# Patient Record
Sex: Male | Born: 1970 | Race: White | Hispanic: No | Marital: Married | Smoking: Never smoker
Health system: Southern US, Community
[De-identification: ages and names within clinical notes are randomized; demographics above are authoritative.]

## PROBLEM LIST (undated history)

## (undated) DIAGNOSIS — K219 Gastro-esophageal reflux disease without esophagitis: Secondary | ICD-10-CM

## (undated) DIAGNOSIS — I10 Essential (primary) hypertension: Secondary | ICD-10-CM

## (undated) DIAGNOSIS — F419 Anxiety disorder, unspecified: Secondary | ICD-10-CM

## (undated) DIAGNOSIS — T7840XA Allergy, unspecified, initial encounter: Secondary | ICD-10-CM

## (undated) HISTORY — DX: Anxiety disorder, unspecified: F41.9

## (undated) HISTORY — DX: Gastro-esophageal reflux disease without esophagitis: K21.9

## (undated) HISTORY — DX: Essential (primary) hypertension: I10

## (undated) HISTORY — DX: Allergy, unspecified, initial encounter: T78.40XA

---

## 2014-08-08 ENCOUNTER — Ambulatory Visit (INDEPENDENT_AMBULATORY_CARE_PROVIDER_SITE_OTHER): Payer: Self-pay | Admitting: Emergency Medicine

## 2014-08-08 VITALS — BP 146/100 | HR 66 | Temp 97.5°F | Resp 18 | Ht 71.0 in | Wt 221.0 lb

## 2014-08-08 DIAGNOSIS — Z021 Encounter for pre-employment examination: Secondary | ICD-10-CM

## 2014-08-08 NOTE — Progress Notes (Signed)
[facility] [address], [place] [address] [phone]  Date:  08/08/2014   Name:  Mark Gamble   DOB:  03-12-1971   MRN:  [id number]  PCP:  No PCP Per Patient    Chief Complaint: Employment Physical   History of Present Illness:  Mark Hawthorneaul Stanislawski is a 43 y.o. very pleasant male patient who presents with the following:  DOT   There are no active problems to display for this patient.   No past medical history on file.  No past surgical history on file.  History  Substance Use Topics  . Smoking status: Not on file  . Smokeless tobacco: Not on file  . Alcohol Use: Not on file    No family history on file.  Not on File  Medication list has been reviewed and updated.  No current outpatient prescriptions on file prior to visit.   No current facility-administered medications on file prior to visit.    Review of Systems:  As per HPI, otherwise negative.    Physical Examination: Filed Vitals:   08/08/14 1338  BP: 146/100  Pulse: 66  Temp: 97.5 F (36.4 C)  Resp: 18   Filed Vitals:   08/08/14 1338  Height: 5\' 11"  (1.803 m)  Weight: 221 lb (100.245 kg)   Body mass index is 30.84 kg/(m^2). Ideal Body Weight: Weight in (lb) to have BMI = 25: 178.9  GEN: WDWN, NAD, Non-toxic, A & O x 3 HEENT: Atraumatic, Normocephalic. Neck supple. No masses, No LAD. Ears and Nose: No external deformity. CV: RRR, No M/G/R. No JVD. No thrill. No extra heart sounds. PULM: CTA B, no wheezes, crackles, rhonchi. No retractions. No resp. distress. No accessory muscle use. ABD: S, NT, ND, +BS. No rebound. No HSM. EXTR: No c/c/e NEURO Normal gait.  PSYCH: Normally interactive. Conversant. Not depressed or anxious appearing.  Calm demeanor.    Assessment and Plan: DOT  Signed,  Phillips OdorJeffery Anderson, MD

## 2015-12-12 ENCOUNTER — Ambulatory Visit (HOSPITAL_COMMUNITY)
Admission: EM | Admit: 2015-12-12 | Discharge: 2015-12-12 | Disposition: A | Discharge status: Home / Self Care | Payer: 59 | Attending: Emergency Medicine | Admitting: Emergency Medicine

## 2015-12-12 ENCOUNTER — Encounter (HOSPITAL_COMMUNITY): Payer: Self-pay | Admitting: Emergency Medicine

## 2015-12-12 DIAGNOSIS — J302 Other seasonal allergic rhinitis: Secondary | ICD-10-CM | POA: Diagnosis not present

## 2015-12-12 NOTE — Discharge Instructions (Signed)
Allergic Rhinitis Allergic rhinitis is when the mucous membranes in the nose respond to allergens. Allergens are particles in the air that cause your body to have an allergic reaction. This causes you to release allergic antibodies. Through a chain of events, these eventually cause you to release histamine into the blood stream. Although meant to protect the body, it is this release of histamine that causes your discomfort, such as frequent sneezing, congestion, and an itchy, runny nose.  CAUSES Seasonal allergic rhinitis (hay fever) is caused by pollen allergens that may come from grasses, trees, and weeds. Year-round allergic rhinitis (perennial allergic rhinitis) is caused by allergens such as house dust mites, pet dander, and mold spores. SYMPTOMS  Nasal stuffiness (congestion).  Itchy, runny nose with sneezing and tearing of the eyes. DIAGNOSIS Your health care provider can help you determine the allergen or allergens that trigger your symptoms. If you and your health care provider are unable to determine the allergen, skin or blood testing may be used. Your health care provider will diagnose your condition after taking your health history and performing a physical exam. Your health care provider may assess you for other related conditions, such as asthma, pink eye, or an ear infection. TREATMENT Allergic rhinitis does not have a cure, but it can be controlled by:  Medicines that block allergy symptoms. These may include allergy shots, nasal sprays, and oral antihistamines.  Avoiding the allergen. Hay fever may often be treated with antihistamines in pill or nasal spray forms. Antihistamines block the effects of histamine. There are over-the-counter medicines that may help with nasal congestion and swelling around the eyes. Check with your health care provider before taking or giving this medicine. If avoiding the allergen or the medicine prescribed do not work, there are many new medicines  your health care provider can prescribe. Stronger medicine may be used if initial measures are ineffective. Desensitizing injections can be used if medicine and avoidance does not work. Desensitization is when a patient is given ongoing shots until the body becomes less sensitive to the allergen. Make sure you follow up with your health care provider if problems continue. HOME CARE INSTRUCTIONS It is not possible to completely avoid allergens, but you can reduce your symptoms by taking steps to limit your exposure to them. It helps to know exactly what you are allergic to so that you can avoid your specific triggers. SEEK MEDICAL CARE IF:  You have a fever.  You develop a cough that does not stop easily (persistent).  You have shortness of breath.  You start wheezing.  Symptoms interfere with normal daily activities.   This information is not intended to replace advice given to you by your health care provider. Make sure you discuss any questions you have with your health care provider.   Document Released: 05/18/2001 Document Revised: 09/13/2014 Document Reviewed: 04/30/2013 Elsevier Interactive Patient Education 2016 [employer] Inc.  

## 2015-12-12 NOTE — ED Provider Notes (Signed)
CSN: [id number]     Arrival date & time 12/12/15  1600 History   First MD Initiated Contact with Patient 12/12/15 1714     Chief Complaint  Patient presents with  . 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 Needs note to return to work. No other symptoms. States flonase helped symptoms a great dea.  History reviewed. No pertinent past medical history. History reviewed. No pertinent past surgical history. No family history on file. Social History  Substance Use Topics  . Smoking status: Never Smoker   . Smokeless tobacco: None  . Alcohol Use: No    Review of Systems Allergies, no sob Allergies  Review of patient's allergies indicates no known allergies.  Home Medications   Prior to Admission medications   Medication Sig Start Date End Date Taking? Authorizing Provider  fluticasone (FLONASE) 50 MCG/ACT nasal spray Place into both nostrils daily.   Yes Historical Provider, MD   Meds Ordered and Administered this Visit  Medications - No data to display  BP 154/99 mmHg  Pulse 68  Temp(Src) 98.1 F (36.7 C) (Oral)  SpO2 97% No data found.   Physical Exam NURSES NOTES AND VITAL SIGNS REVIEWED. CONSTITUTIONAL: Well developed, well nourished, no acute distress HEENT: normocephalic, atraumatic EYES: Conjunctiva normal NECK:normal ROM, supple, no adenopathy PULMONARY:No respiratory distress, normal effort, LUNGS CTA b/l MUSCULOSKELETAL: Normal ROM of all extremities,  SKIN: warm and dry without rash PSYCHIATRIC: Mood and affect, behavior are normal  ED Course  Procedures (including critical care time)  Labs Review Labs Reviewed - No data to display  Imaging Review No results found.   Visual Acuity Review  Right Eye Distance:   Left Eye Distance:   Bilateral Distance:    Right Eye Near:   Left Eye Near:    Bilateral Near:          MDM  No diagnosis found.  Patient is reassured that there are no issues that require transfer to higher level of care at this time or additional tests. Patient is advised to continue home symptomatic treatment. Patient is advised that if there are new or worsening symptoms to attend the emergency department, contact primary care provider, or return to UC. Instructions of care provided discharged home in stable condition.    THIS NOTE WAS GENERATED USING A VOICE RECOGNITION SOFTWARE PROGRAM. ALL REASONABLE EFFORTS  WERE MADE TO PROOFREAD THIS DOCUMENT FOR ACCURACY.  I have verbally reviewed the discharge instructions with the patient. A printed AVS was given to the patient.  All questions were answered prior to discharge.      Tharon AquasFrank C Harshika Mago, PA 12/12/15 1737

## 2015-12-12 NOTE — ED Notes (Signed)
Here for itchy throat, dry cough and HA onset x1 week... Taking flonase w/releif.  A&O x4... No acute distress.

## 2019-10-12 ENCOUNTER — Other Ambulatory Visit: Payer: Self-pay | Admitting: Gastroenterology

## 2019-10-12 DIAGNOSIS — R1011 Right upper quadrant pain: Secondary | ICD-10-CM

## 2019-10-15 ENCOUNTER — Ambulatory Visit
Admission: RE | Admit: 2019-10-15 | Discharge: 2019-10-15 | Disposition: A | Discharge status: Home / Self Care | Payer: 59 | Source: Ambulatory Visit | Attending: Gastroenterology | Admitting: Gastroenterology

## 2019-10-15 DIAGNOSIS — R1011 Right upper quadrant pain: Secondary | ICD-10-CM

## 2021-04-26 IMAGING — US US ABDOMEN LIMITED
1 series · 14 of 25 positions shown · non-contrast
Comparison: None.

CLINICAL DATA: Right upper quadrant abdominal pain for 4 years.

EXAM:
ULTRASOUND ABDOMEN LIMITED RIGHT UPPER QUADRANT

[Series 1: us abdomen limited · 0.23mm/px · 14 of 45 slices shown]
[im 1/45]
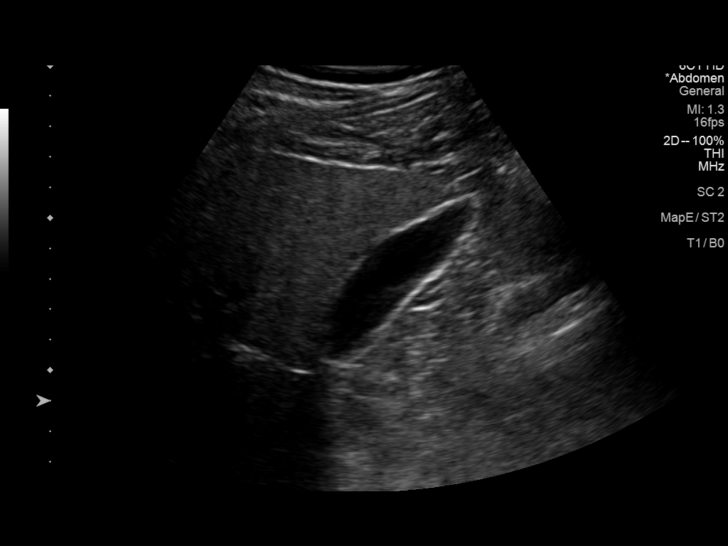
[im 4/45]
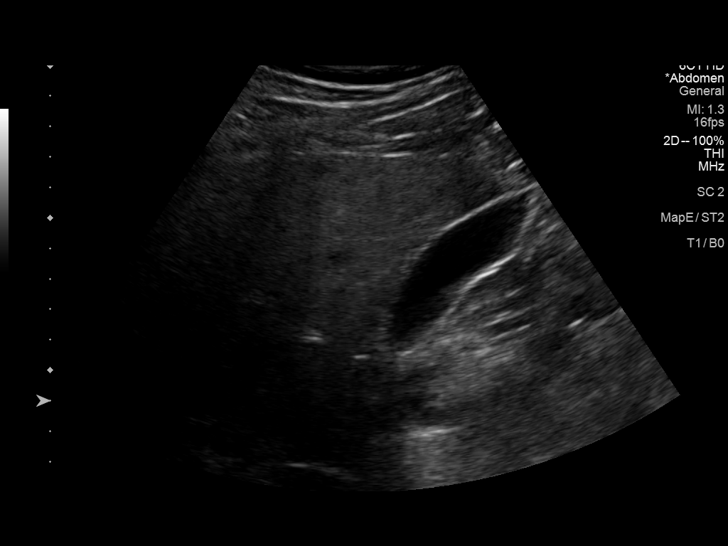
[im 8/45]
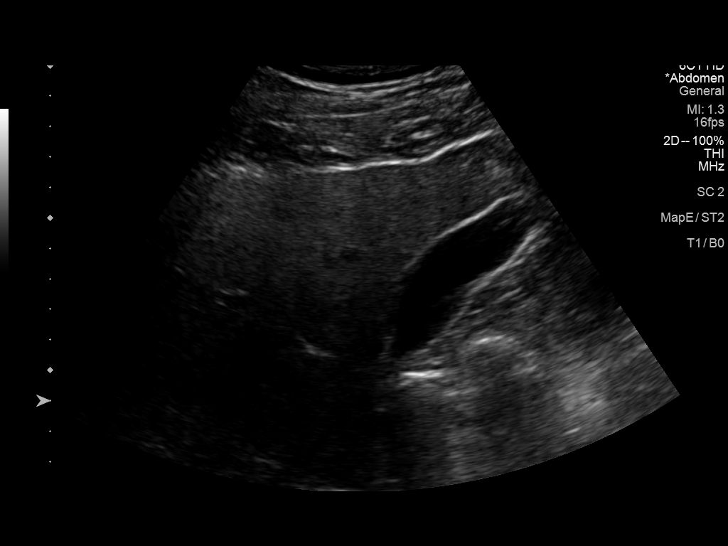
[im 12/45]
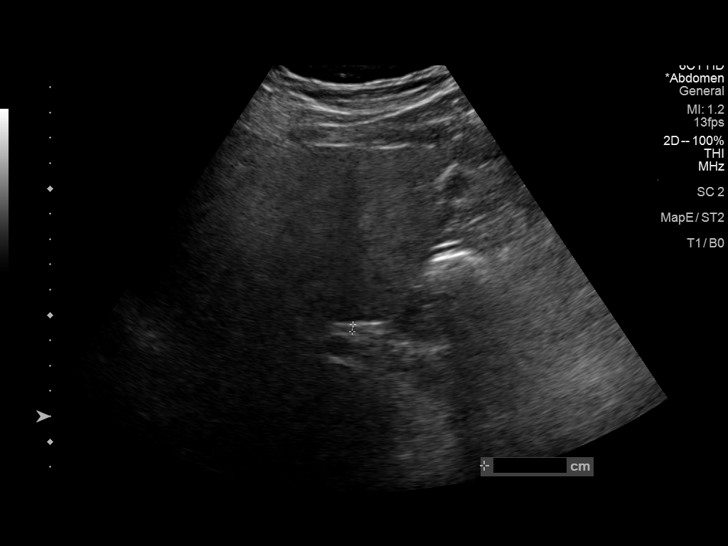
[im 15/45]
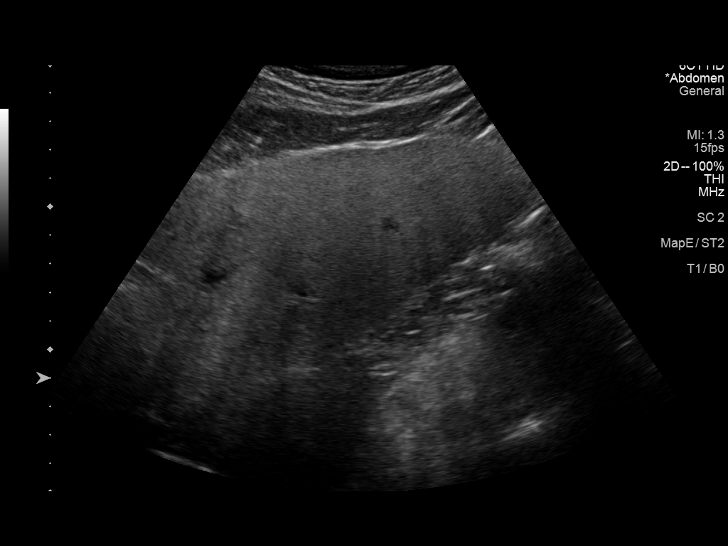
[im 17/45]
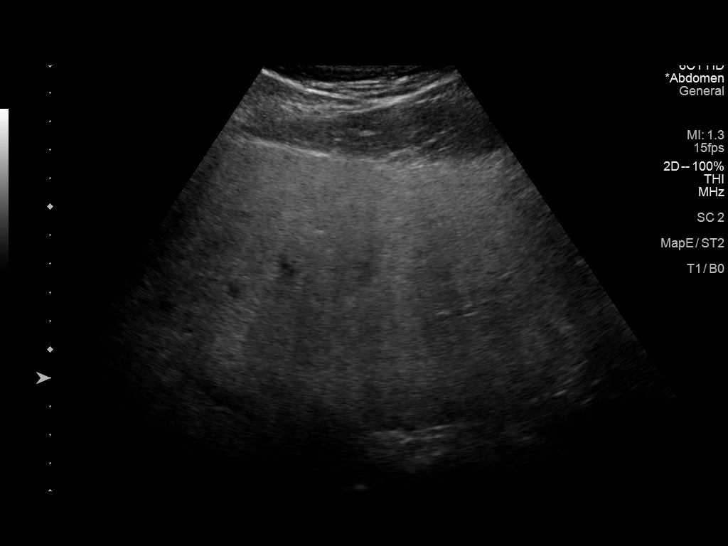
[im 21/45]
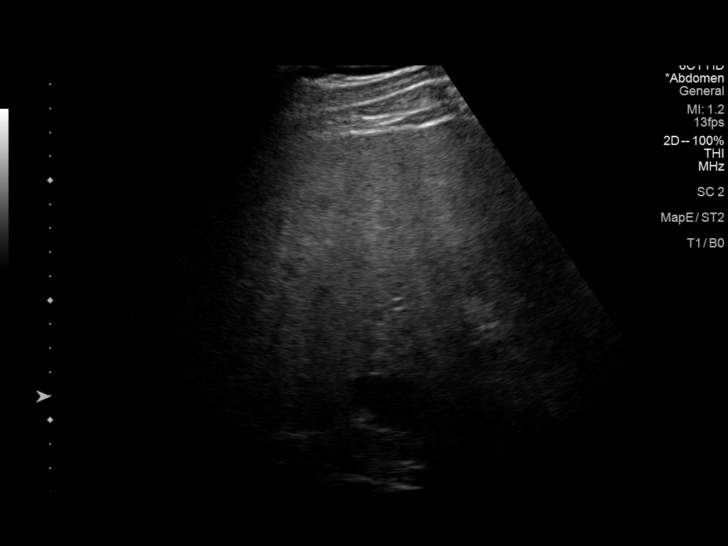
[im 24/45]
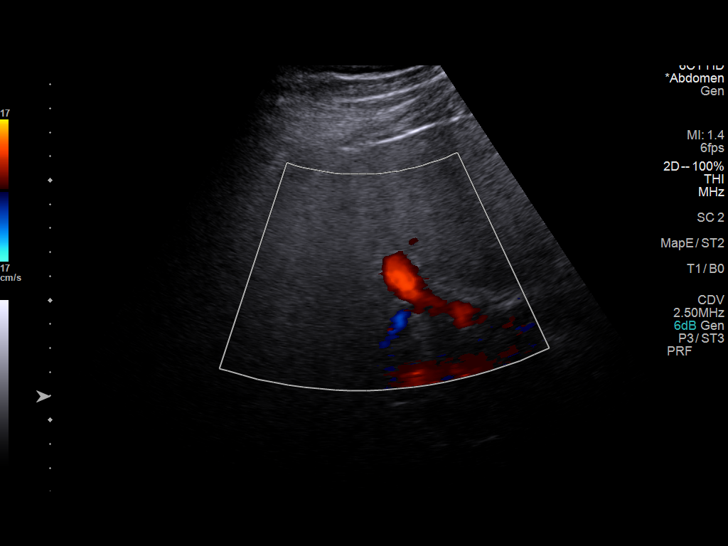
[im 28/45]
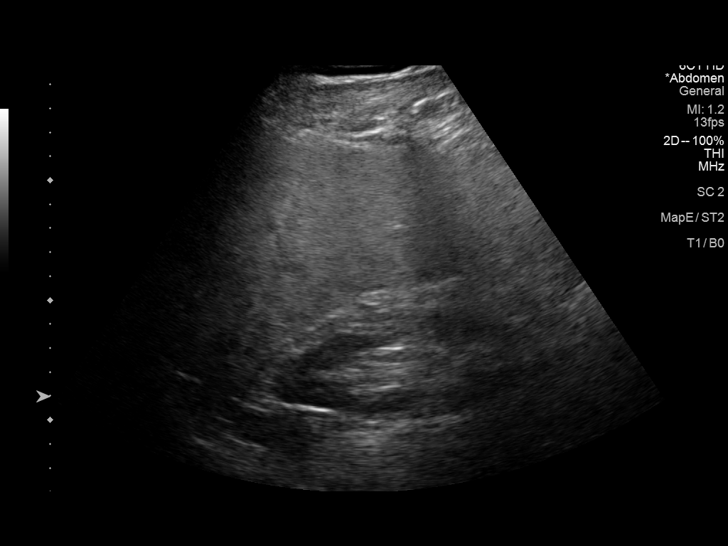
[im 30/45]
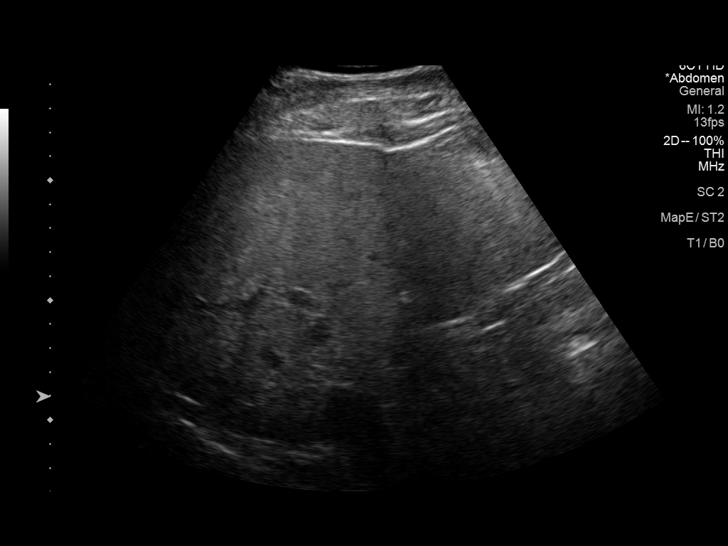
[im 34/45]
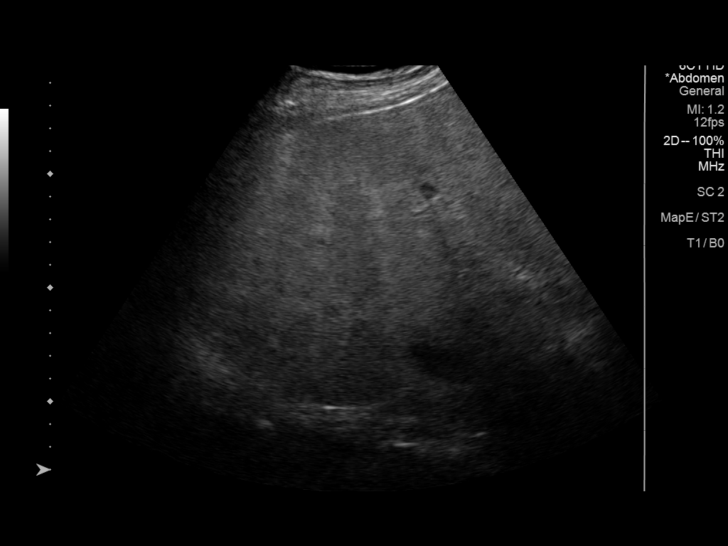
[im 37/45]
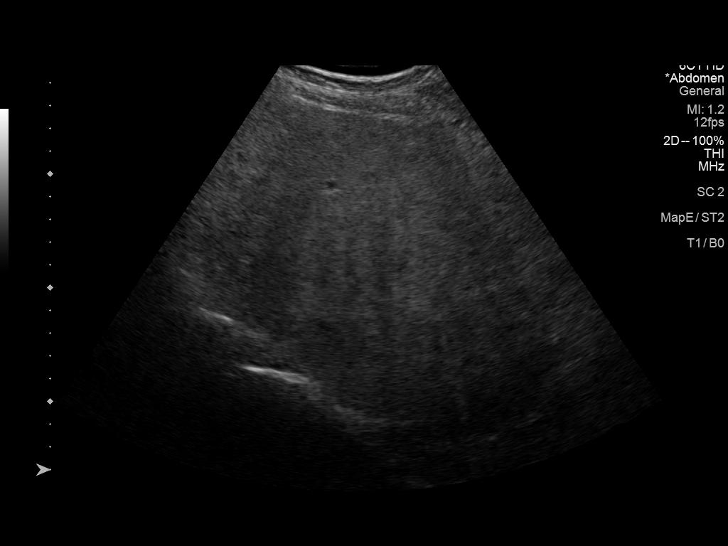
[im 41/45]
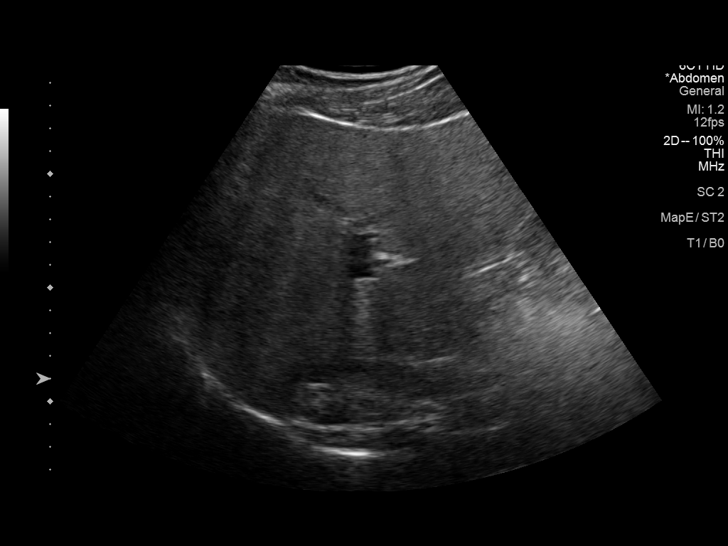
[im 45/45]
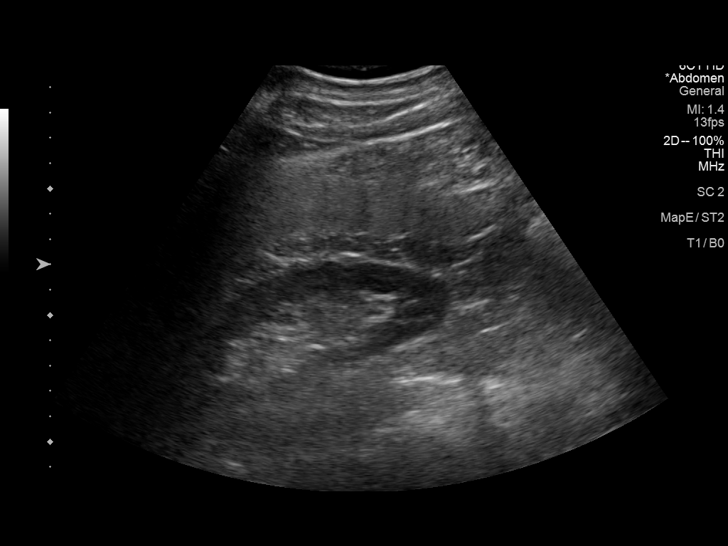

[14 of 25 positions shown; findings below may reference images not displayed]

FINDINGS: Gallbladder:

No gallstones or wall thickening visualized. No sonographic Murphy
sign noted by sonographer.

Common bile duct:

Diameter: 3 mm

Liver:

Coarse hepatic echotexture with possible mild contour irregularity.
No focal lesions are identified. Portal vein is patent on color
Doppler imaging with normal direction of blood flow towards the
liver.

Other: No ascites.
IMPRESSION: 1. No acute right upper quadrant findings. The gallbladder appears
normal.
2. Coarse appendix echotexture with possible mild contour
irregularity, suggesting possible cirrhosis. No focal lesions
identified.

## 2021-05-29 ENCOUNTER — Encounter: Payer: BLUE CROSS/BLUE SHIELD | Admitting: Family Medicine

## 2021-06-03 ENCOUNTER — Encounter: Payer: BLUE CROSS/BLUE SHIELD | Admitting: Family Medicine

## 2021-06-08 NOTE — Progress Notes (Signed)
HPI: Mark Gamble is a 50 y.o. male, who is here today to establish care.  Former PCP: Dr Mark Gamble. Last preventive routine visit: 08/2015.  Chronic medical problems: Allergies, GERD, and elevated BP readings.  Concerns today: Depressed mood and worsening anxiety.  Since COVID 5 pandemia he is having intermittent episodes of panic attacks, depressed mood,insomnia,nausea,shaking, and diarreha.  He is gaining wt. Trying to eat healthier. No motivation. 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 Denies hx o depression or anxiety but reports being institutionalized at age 69 after his father found out that he was smoking marijuana.  At that time he was given haloperidol + other medications. Denies history of bipolar disorder.  Father committed suicide after being diagnosed with lung cancer.  He is drinking alcohol to help him sleep. He has hx of elevated transaminases. 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  According to patient, he was seen by GI for nausea and diarrhea, Dx;ed with IBS. He underwent EGD about 2 months ago, he has not had a colonoscopy done.  Depression screen PHQ 2/9 06/09/2021  Decreased Interest 3  Down, Depressed, Hopeless 2  PHQ - 2 Score 5  Altered sleeping 3  Tired, decreased energy 1  Change in appetite 2  Feeling bad or failure about yourself  3  Trouble concentrating 1  Moving slowly or fidgety/restless 2  Suicidal thoughts 1  PHQ-9 Score 18  Difficult doing work/chores Very difficult   GAD 7 : Generalized Anxiety Score 06/09/2021  Nervous, Anxious, on Edge 3  Control/stop worrying 3  Worry too much - different things 3  Trouble relaxing 3  Restless 3  Easily annoyed or irritable 2  Afraid - awful might happen 2  Total GAD 7 Score 19  Anxiety Difficulty Very difficult      Elevated BP today. No known hx  of HTN.  In 211/015 BP was elevated at 153/101. He has BP readings: 150s/95, 138/98, 127/94, 234/119 (he was under a lot of stress).  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 HR high 90's-112/min.  Negative for severe/frequent headache, visual changes, chest pain, dyspnea, palpitation, focal weakness, or edema.   Ref Range & Units 6 yr ago  Glucose 65 - 99 mg/dL 92   BUN 6 - 24 mg/dL 18   Creatinine 0.76 - 1.27 mg/dL 1.16   eGFR If NonAfrican American >59 mL/min/1.73 76   eGFR If African American >59 mL/min/1.73 88   BUN/Creatinine Ratio 9 - 20 16   Sodium 134 - 144 mmol/L 142   Potassium 3.5 - 5.2 mmol/L 5.1   Chloride 97 - 108 mmol/L 99   CO2 18 - 29 mmol/L 21   CALCIUM 8.7 - 10.2 mg/dL 10.3 High    Total Protein 6.0 - 8.5 g/dL 7.9   Albumin, Serum 3.5 - 5.5 g/dL 5.0   Globulin, Total 1.5 - 4.5 g/dL 2.9   Albumin/Globulin Ratio 1.1 - 2.5 1.7   Total Bilirubin 0.0 - 1.2 mg/dL 0.4   Alkaline Phosphatase 39 - 117 IU/L 54   AST 0 - 40 IU/L 43 High    ALT (SGPT) 0 - 44 IU/L 47 High     Review of Systems  Constitutional:  Positive for fatigue. Negative for activity change, appetite change and fever.  HENT:  Negative for nosebleeds and sore throat.  Respiratory:  Negative for cough and wheezing.   Gastrointestinal:  Negative for abdominal pain and vomiting.  Genitourinary:  Negative for decreased urine volume, dysuria and hematuria.  Neurological:  Positive for tremors. Negative for syncope and weakness.  Psychiatric/Behavioral:  Positive for sleep disturbance. Negative for confusion and hallucinations. The patient is nervous/anxious.   Rest see pertinent positives and negatives per HPI.  No current outpatient medications on file prior to visit.   No current facility-administered medications on file prior to visit.   Past Medical History:  Diagnosis Date   Anxiety    GERD (gastroesophageal reflux disease)    No Known Allergies  Family History  Problem Relation Age of  Onset   Depression Father    Anxiety disorder Father    Cancer Father        lung cancer   Alcohol abuse Father    Social History   Socioeconomic History   Marital status: Married    Spouse name: Not on file   Number of children: Not on file   Years of education: Not on file   Highest education level: Not on file  Occupational History   Not on file  Tobacco Use   Smoking status: Never   Smokeless tobacco: Never  Substance and Sexual Activity   Alcohol use: No   Drug use: Not Currently    Comment: Marijuana at age 36.   Sexual activity: Yes  Other Topics Concern   Not on file  Social History Narrative   Not on file   Social Determinants of Health   Financial Resource Strain: Not on file  Food Insecurity: Not on file  Transportation Needs: Not on file  Physical Activity: Not on file  Stress: Not on file  Social Connections: Not on file   Vitals:   06/09/21 1530  BP: (!) 140/100  Pulse: (!) 102  Resp: 16  SpO2: 98%   Body mass index is 37.41 kg/m.  Physical Exam Vitals and nursing note reviewed.  Constitutional:      General: He is not in acute distress.    Appearance: He is well-developed.  HENT:     Head: Normocephalic and atraumatic.     Mouth/Throat:     Mouth: Mucous membranes are moist.     Pharynx: Oropharynx is clear.  Eyes:     Conjunctiva/sclera: Conjunctivae normal.  Cardiovascular:     Rate and Rhythm: Regular rhythm. Tachycardia present.     Pulses:          Dorsalis pedis pulses are 2+ on the right side and 2+ on the left side.     Heart sounds: No murmur heard.    Comments: Trace pitting LE edema, bilateral. Pulmonary:     Effort: Pulmonary effort is normal. No respiratory distress.     Breath sounds: Normal breath sounds.  Abdominal:     Palpations: Abdomen is soft. There is no hepatomegaly or mass.     Tenderness: There is no abdominal tenderness.  Lymphadenopathy:     Cervical: No cervical adenopathy.  Skin:    General: Skin  is warm.     Findings: No erythema or rash.  Neurological:     Mental Status: He is alert and oriented to person, place, and time.     Cranial Nerves: No cranial nerve deficit.     Motor: Tremor (Mild, hands.) present.     Gait: Gait normal.  Psychiatric:        Mood and Affect: Mood is  anxious.     Comments: Well groomed, good eye contact.   ASSESSMENT AND PLAN:  Mark Gamble was seen today for establish care.  Diagnoses and all orders for this visit: Orders Placed This Encounter  Procedures   Basic metabolic panel   Hepatic function panel   TSH   CBC   VITAMIN D 25 Hydroxy (Vit-D Deficiency, Fractures)   Lab Results  Component Value Date   TSH 0.95 06/09/2021   Lab Results  Component Value Date   ALT 131 (H) 06/09/2021   AST 136 (H) 06/09/2021   ALKPHOS 53 06/09/2021   BILITOT 0.5 06/09/2021   Lab Results  Component Value Date   WBC 10.2 06/09/2021   HGB 14.4 06/09/2021   HCT 42.4 06/09/2021   MCV 93.2 06/09/2021   PLT 183.0 06/09/2021   Lab Results  Component Value Date   CREATININE 1.10 06/09/2021   BUN 15 06/09/2021   NA 138 06/09/2021   K 4.3 06/09/2021   CL 103 06/09/2021   CO2 25 06/09/2021   Elevated transaminase level Hx of HCV infection, treated in 2016. We discussed adverse effects of high alcohol intake. Further recommendation will be given according to LFTs.  Hypercalcemia Mild. We discussed possible etiologies.  Elevated blood pressure reading Recheck: 180/110. He is reporting that home BP readings are slowly getting better, so for now we will hold on pharmacologic treatment. We discussed possible complications of elevated BP. If BP continues elevated, we can consider beta-blocker,which may also help with anxiety and mild tachycardia. Low salt/DASH diet recommended.  GAD (generalized anxiety disorder) CBT may help. Hydroxyzine 25 mg 3 times daily as needed for acute anxiety, we discussed some side effects.  -     escitalopram (LEXAPRO)  10 MG tablet; Take 1 tablet (10 mg total) by mouth daily. -     hydrOXYzine (ATARAX/VISTARIL) 25 MG tablet; Take 1 tablet (25 mg total) by mouth 3 (three) times daily as needed for anxiety.  Depression, major, single episode, severe ([place]) Discussed pharmacologic treatment options, he agrees with trying Lexapro. We discussed some side effects. CBT may help. Instructed about warning signs. F/U in 5 weeks, before if needed.  -     escitalopram (LEXAPRO) 10 MG tablet; Take 1 tablet (10 mg total) by mouth daily.  I spent a total of 68 minutes in both face to face and non face to face activities for this visit on the date of this encounter. During this time history was obtained and documented, examination was performed, prior labs reviewed, and assessment/plan discussed.  Return in about 5 weeks (around 07/14/2021).  Jairen Goldfarb G. [place], MD  [facility]. [place] office.

## 2021-06-09 ENCOUNTER — Encounter: Payer: Self-pay | Admitting: Family Medicine

## 2021-06-09 ENCOUNTER — Ambulatory Visit (INDEPENDENT_AMBULATORY_CARE_PROVIDER_SITE_OTHER): Payer: BC Managed Care – PPO | Admitting: Family Medicine

## 2021-06-09 ENCOUNTER — Other Ambulatory Visit: Payer: Self-pay

## 2021-06-09 VITALS — BP 140/100 | HR 102 | Resp 16 | Ht 71.0 in | Wt 268.2 lb

## 2021-06-09 DIAGNOSIS — F322 Major depressive disorder, single episode, severe without psychotic features: Secondary | ICD-10-CM

## 2021-06-09 DIAGNOSIS — R7401 Elevation of levels of liver transaminase levels: Secondary | ICD-10-CM | POA: Diagnosis not present

## 2021-06-09 DIAGNOSIS — F411 Generalized anxiety disorder: Secondary | ICD-10-CM | POA: Diagnosis not present

## 2021-06-09 DIAGNOSIS — I160 Hypertensive urgency: Secondary | ICD-10-CM | POA: Insufficient documentation

## 2021-06-09 DIAGNOSIS — R030 Elevated blood-pressure reading, without diagnosis of hypertension: Secondary | ICD-10-CM

## 2021-06-09 DIAGNOSIS — F331 Major depressive disorder, recurrent, moderate: Secondary | ICD-10-CM | POA: Insufficient documentation

## 2021-06-09 DIAGNOSIS — I10 Essential (primary) hypertension: Secondary | ICD-10-CM | POA: Insufficient documentation

## 2021-06-09 MED ORDER — HYDROXYZINE HCL 25 MG PO TABS: 25.0000 mg | ORAL_TABLET | Freq: Three times a day (TID) | ORAL | 1 refills | Status: DC | PRN

## 2021-06-09 MED ORDER — ESCITALOPRAM OXALATE 10 MG PO TABS: 10.0000 mg | ORAL_TABLET | Freq: Every day | ORAL | 1 refills | Status: DC

## 2021-06-09 NOTE — Patient Instructions (Addendum)
A few things to remember from today's visit:  Elevated transaminase level - Plan: Hepatic function panel  Hypercalcemia - Plan: Basic metabolic panel, CBC, VITAMIN D 25 Hydroxy (Vit-D Deficiency, Fractures)  Elevated blood pressure reading - Plan: TSH  If you need refills please call your pharmacy. Do not use My Chart to request refills or for acute issues that need immediate attention.   Today we started Lexapro, this type of medications can increase suicidal risk. This is more prevalent among children,adolecents, and young adults with major depression or other psychiatric disorders. It can also make depression worse. Most common side effects are gastrointestinal, self limited after a few weeks: diarrhea, nausea, constipation  Or diarrhea among some.  In general it is well tolerated. We will follow closely.  Please be sure medication list is accurate. If a new problem present, please set up appointment sooner than planned today. DASH Eating Plan DASH stands for Dietary Approaches to Stop Hypertension. The DASH eating plan is a healthy eating plan that has been shown to: Reduce high blood pressure (hypertension). Reduce your risk for type 2 diabetes, heart disease, and stroke. Help with weight loss. What are tips for following this plan? Reading food labels Check food labels for the amount of salt (sodium) per serving. Choose foods with less than 5 percent of the Daily Value of sodium. Generally, foods with less than 300 milligrams (mg) of sodium per serving fit into this eating plan. To find whole grains, look for the word "whole" as the first word in the ingredient list. Shopping Buy products labeled as "low-sodium" or "no salt added." Buy fresh foods. Avoid canned foods and pre-made or frozen meals. Cooking Avoid adding salt when cooking. Use salt-free seasonings or herbs instead of table salt or sea salt. Check with your health care provider or pharmacist before using salt  substitutes. Do not fry foods. Cook foods using healthy methods such as baking, boiling, grilling, roasting, and broiling instead. Cook with heart-healthy oils, such as olive, canola, avocado, soybean, or sunflower oil. Meal planning  Eat a balanced diet that includes: 4 or more servings of fruits and 4 or more servings of vegetables each day. Try to fill one-half of your plate with fruits and vegetables. 6-8 servings of whole grains each day. Less than 6 oz (170 g) of lean meat, poultry, or fish each day. A 3-oz (85-g) serving of meat is about the same size as a deck of cards. One egg equals 1 oz (28 g). 2-3 servings of low-fat dairy each day. One serving is 1 cup (237 mL). 1 serving of nuts, seeds, or beans 5 times each week. 2-3 servings of heart-healthy fats. Healthy fats called omega-3 fatty acids are found in foods such as walnuts, flaxseeds, fortified milks, and eggs. These fats are also found in cold-water fish, such as sardines, salmon, and mackerel. Limit how much you eat of: Canned or prepackaged foods. Food that is high in trans fat, such as some fried foods. Food that is high in saturated fat, such as fatty meat. Desserts and other sweets, sugary drinks, and other foods with added sugar. Full-fat dairy products. Do not salt foods before eating. Do not eat more than 4 egg yolks a week. Try to eat at least 2 vegetarian meals a week. Eat more home-cooked food and less restaurant, buffet, and fast food. Lifestyle When eating at a restaurant, ask that your food be prepared with less salt or no salt, if possible. If you drink alcohol: Limit  how much you use to: 0-1 drink a day for women who are not pregnant. 0-2 drinks a day for men. Be aware of how much alcohol is in your drink. In the [place] one drink equals one 12 oz bottle of beer (355 mL), one 5 oz glass of wine (148 mL), or one 1 oz glass of hard liquor (44 mL). General information Avoid eating more than 2,300 mg of salt a  day. If you have hypertension, you may need to reduce your sodium intake to 1,500 mg a day. Work with your health care provider to maintain a healthy body weight or to lose weight. Ask what an ideal weight is for you. Get at least 30 minutes of exercise that causes your heart to beat faster (aerobic exercise) most days of the week. Activities may include walking, swimming, or biking. Work with your health care provider or dietitian to adjust your eating plan to your individual calorie needs. What foods should I eat? Fruits All fresh, dried, or frozen fruit. Canned fruit in natural juice (without added sugar). Vegetables Fresh or frozen vegetables (raw, steamed, roasted, or grilled). Low-sodium or reduced-sodium tomato and vegetable juice. Low-sodium or reduced-sodium tomato sauce and tomato paste. Low-sodium or reduced-sodium canned vegetables. Grains Whole-grain or whole-wheat bread. Whole-grain or whole-wheat pasta. Brown rice. Orpah Cobb. Bulgur. Whole-grain and low-sodium cereals. Pita bread. Low-fat, low-sodium crackers. Whole-wheat flour tortillas. Meats and other proteins Skinless chicken or [place]. Ground chicken or [place]. Pork with fat trimmed off. Fish and seafood. Egg whites. Dried beans, peas, or lentils. Unsalted nuts, nut butters, and seeds. Unsalted canned beans. Lean cuts of beef with fat trimmed off. Low-sodium, lean precooked or cured meat, such as sausages or meat loaves. Dairy Low-fat (1%) or fat-free (skim) milk. Reduced-fat, low-fat, or fat-free cheeses. Nonfat, low-sodium ricotta or cottage cheese. Low-fat or nonfat yogurt. Low-fat, low-sodium cheese. Fats and oils Soft margarine without trans fats. Vegetable oil. Reduced-fat, low-fat, or light mayonnaise and salad dressings (reduced-sodium). Canola, safflower, olive, avocado, soybean, and sunflower oils. Avocado. Seasonings and condiments Herbs. Spices. Seasoning mixes without salt. Other foods Unsalted popcorn and  pretzels. Fat-free sweets. The items listed above may not be a complete list of foods and beverages you can eat. Contact a dietitian for more information. What foods should I avoid? Fruits Canned fruit in a light or heavy syrup. Fried fruit. Fruit in cream or butter sauce. Vegetables Creamed or fried vegetables. Vegetables in a cheese sauce. Regular canned vegetables (not low-sodium or reduced-sodium). Regular canned tomato sauce and paste (not low-sodium or reduced-sodium). Regular tomato and vegetable juice (not low-sodium or reduced-sodium). Rosita Fire. Olives. Grains Baked goods made with fat, such as croissants, muffins, or some breads. Dry pasta or rice meal packs. Meats and other proteins Fatty cuts of meat. Ribs. Fried meat. Tomasa Blase. Bologna, salami, and other precooked or cured meats, such as sausages or meat loaves. Fat from the back of a pig (fatback). Bratwurst. Salted nuts and seeds. Canned beans with added salt. Canned or smoked fish. Whole eggs or egg yolks. Chicken or [place] with skin. Dairy Whole or 2% milk, cream, and half-and-half. Whole or full-fat cream cheese. Whole-fat or sweetened yogurt. Full-fat cheese. Nondairy creamers. Whipped toppings. Processed cheese and cheese spreads. Fats and oils Butter. Stick margarine. Lard. Shortening. Ghee. Bacon fat. Tropical oils, such as coconut, palm kernel, or palm oil. Seasonings and condiments Onion salt, garlic salt, seasoned salt, table salt, and sea salt. Worcestershire sauce. Tartar sauce. Barbecue sauce. Teriyaki sauce. Soy sauce, including  reduced-sodium. Steak sauce. Canned and packaged gravies. Fish sauce. Oyster sauce. Cocktail sauce. Store-bought horseradish. Ketchup. Mustard. Meat flavorings and tenderizers. Bouillon cubes. Hot sauces. Pre-made or packaged marinades. Pre-made or packaged taco seasonings. Relishes. Regular salad dressings. Other foods Salted popcorn and pretzels. The items listed above may not be a complete list  of foods and beverages you should avoid. Contact a dietitian for more information. Where to find more information National Heart, Lung, and Blood Institute: PopSteam.is American Heart Association: www.heart.org Academy of Nutrition and Dietetics: www.eatright.org National Kidney Foundation: www.kidney.org Summary The DASH eating plan is a healthy eating plan that has been shown to reduce high blood pressure (hypertension). It may also reduce your risk for type 2 diabetes, heart disease, and stroke. When on the DASH eating plan, aim to eat more fresh fruits and vegetables, whole grains, lean proteins, low-fat dairy, and heart-healthy fats. With the DASH eating plan, you should limit salt (sodium) intake to 2,300 mg a day. If you have hypertension, you may need to reduce your sodium intake to 1,500 mg a day. Work with your health care provider or dietitian to adjust your eating plan to your individual calorie needs. This information is not intended to replace advice given to you by your health care provider. Make sure you discuss any questions you have with your health care provider. Document Revised: 07/27/2019 Document Reviewed: 07/27/2019 Elsevier Patient Education  2022 [employer].

## 2021-06-10 LAB — HEPATIC FUNCTION PANEL
ALT: 131 U/L — ABNORMAL HIGH (ref 0–53)
AST: 136 U/L — ABNORMAL HIGH (ref 0–37)
Albumin: 4.4 g/dL (ref 3.5–5.2)
Alkaline Phosphatase: 53 U/L (ref 39–117)
Bilirubin, Direct: 0 mg/dL (ref 0.0–0.3)
Total Bilirubin: 0.5 mg/dL (ref 0.2–1.2)
Total Protein: 7.4 g/dL (ref 6.0–8.3)

## 2021-06-10 LAB — BASIC METABOLIC PANEL
BUN: 15 mg/dL (ref 6–23)
CO2: 25 mEq/L (ref 19–32)
Calcium: 9.5 mg/dL (ref 8.4–10.5)
Chloride: 103 mEq/L (ref 96–112)
Creatinine, Ser: 1.1 mg/dL (ref 0.40–1.50)
GFR: 77.74 mL/min (ref >60.00)
Glucose, Bld: 77 mg/dL (ref 70–99)
Potassium: 4.3 mEq/L (ref 3.5–5.1)
Sodium: 138 mEq/L (ref 135–145)

## 2021-06-10 LAB — CBC
HCT: 42.4 % (ref 39.0–52.0)
Hemoglobin: 14.4 g/dL (ref 13.0–17.0)
MCHC: 33.9 g/dL (ref 30.0–36.0)
MCV: 93.2 fl (ref 78.0–100.0)
Platelets: 183 10*3/uL (ref 150.0–400.0)
RBC: 4.55 Mil/uL (ref 4.22–5.81)
RDW: 13.9 % (ref 11.5–15.5)
WBC: 10.2 10*3/uL (ref 4.0–10.5)

## 2021-06-10 LAB — TSH: TSH: 0.95 u[IU]/mL (ref 0.35–5.50)

## 2021-06-10 LAB — VITAMIN D 25 HYDROXY (VIT D DEFICIENCY, FRACTURES): VITD: 19.2 ng/mL — ABNORMAL LOW (ref 30.00–100.00)

## 2021-06-16 ENCOUNTER — Telehealth: Payer: Self-pay | Admitting: Family Medicine

## 2021-06-16 NOTE — Telephone Encounter (Signed)
Pt call and stated he is not going to take the test that dr.Jordan order because he had that same test last year pt stated if dr.Jordan have any question she can call him.

## 2021-06-16 NOTE — Telephone Encounter (Signed)
Result note sent to Dr. Swaziland.

## 2021-06-22 ENCOUNTER — Other Ambulatory Visit: Payer: BC Managed Care – PPO

## 2021-07-10 NOTE — Progress Notes (Deleted)
    HPI:  Mr.Gage Rackley is a 50 y.o. male, who is here today to follow on recent OV/ED visit.  Review of Systems Rest see pertinent positives and negatives per HPI.  Current Outpatient Medications on File Prior to Visit  Medication Sig Dispense Refill   escitalopram (LEXAPRO) 10 MG tablet Take 1 tablet (10 mg total) by mouth daily. 30 tablet 1   hydrOXYzine (ATARAX/VISTARIL) 25 MG tablet Take 1 tablet (25 mg total) by mouth 3 (three) times daily as needed for anxiety. 30 tablet 1   No current facility-administered medications on file prior to visit.    Past Medical History:  Diagnosis Date   Anxiety    GERD (gastroesophageal reflux disease)    No Known Allergies  Social History   Socioeconomic History   Marital status: Married    Spouse name: Not on file   Number of children: Not on file   Years of education: Not on file   Highest education level: Not on file  Occupational History   Not on file  Tobacco Use   Smoking status: Never   Smokeless tobacco: Never  Substance and Sexual Activity   Alcohol use: No   Drug use: Not Currently    Comment: Marijuana at age 45.   Sexual activity: Yes  Other Topics Concern   Not on file  Social History Narrative   Not on file   Social Determinants of Health   Financial Resource Strain: Not on file  Food Insecurity: Not on file  Transportation Needs: Not on file  Physical Activity: Not on file  Stress: Not on file  Social Connections: Not on file    There were no vitals filed for this visit. There is no height or weight on file to calculate BMI.  Physical Exam  ASSESSMENT AND PLAN:   There are no diagnoses linked to this encounter.  No orders of the defined types were placed in this encounter.   No problem-specific Assessment & Plan notes found for this encounter.   No follow-ups on file.   Betty G. [place], MD  [facility]. [place] office.

## 2021-07-13 ENCOUNTER — Ambulatory Visit: Payer: BC Managed Care – PPO | Admitting: Family Medicine

## 2021-07-24 ENCOUNTER — Ambulatory Visit: Payer: BC Managed Care – PPO | Admitting: Family Medicine

## 2021-07-24 ENCOUNTER — Encounter: Payer: Self-pay | Admitting: Family Medicine

## 2021-07-24 VITALS — BP 180/90 | HR 90 | Temp 98.3°F | Resp 16 | Ht 71.0 in | Wt 269.4 lb

## 2021-07-24 DIAGNOSIS — G4700 Insomnia, unspecified: Secondary | ICD-10-CM

## 2021-07-24 DIAGNOSIS — F322 Major depressive disorder, single episode, severe without psychotic features: Secondary | ICD-10-CM | POA: Diagnosis not present

## 2021-07-24 DIAGNOSIS — R7989 Other specified abnormal findings of blood chemistry: Secondary | ICD-10-CM

## 2021-07-24 DIAGNOSIS — I10 Essential (primary) hypertension: Secondary | ICD-10-CM

## 2021-07-24 DIAGNOSIS — F411 Generalized anxiety disorder: Secondary | ICD-10-CM | POA: Diagnosis not present

## 2021-07-24 MED ORDER — TRAZODONE HCL 50 MG PO TABS: 25.0000 mg | ORAL_TABLET | Freq: Every evening | ORAL | 1 refills | Status: DC | PRN

## 2021-07-24 NOTE — Progress Notes (Signed)
[name] is a 50 y.o.male, who is here today for follow up. He was seen on 06/09/21. Anxiety and depression: Lexapro 10 mg was started.  In general he has tolerated medication well, for the first 2 weeks he felt drowsy. Some of the symptoms got better 3 weeks after taking medication but he thinks he is getting used to med because for the past few days he has not dealt well with stress, "overreacting."  Angry thoughts have improved. Wife and friends have noted improvement in mood.  Hydroxyzine did not help with acute anxiety and it made him very sleepy, caused dry eyes. He threw medication away. He would like to try something else that "is not addictive." Still waking up a few times per night, feeling nervous.  He has not tried CBT. He speaks with friends and states that his wife is a Warden/ranger.  Last OV his BP was elevated. No hx of HTN. Reports BP's 138/90's when he used to checked BP. Feels his heart "racing" when he is anxious. Negative for unusual or severe headache, visual changes, exertional chest pain, dyspnea,  focal weakness, or edema.  Lab Results  Component Value Date   CREATININE 1.10 06/09/2021   BUN 15 06/09/2021   NA 138 06/09/2021   K 4.3 06/09/2021   CL 103 06/09/2021   CO2 25 06/09/2021   Elevated transaminases: He did not have RUQ US done, he did not feel like he needed it because he had one about a year ago.  He stopped alcohol intake for a couple weeks but started drinking again, not as much as he was drinking before. He drinks at bedtime because it helps him sleep better. A couple glasses of rum and diet coke.  Negative for abdominal pain, nausea, vomiting, jaundice,or melena.  Lab Results  Component Value Date   ALT 131 (H) 06/09/2021   AST 136 (H) 06/09/2021   ALKPHOS 53 06/09/2021   BILITOT 0.5 06/09/2021  Treated for HCV infection and followed for 5 years. According to pt, he was told by hepatologist, he did not needed further follow  up.  Review of Systems  Constitutional:  Positive for fatigue. Negative for activity change, appetite change, chills and fever.  HENT:  Negative for sore throat and trouble swallowing.   Respiratory:  Negative for cough and wheezing.   Genitourinary:  Negative for decreased urine volume, dysuria and hematuria.  Musculoskeletal:  Negative for gait problem and myalgias.  Neurological:  Negative for syncope and facial asymmetry.  Psychiatric/Behavioral:  Positive for sleep disturbance. The patient is nervous/anxious.   Rest see pertinent positives and negatives per HPI.  Current Outpatient Medications on File Prior to Visit  Medication Sig Dispense Refill   escitalopram (LEXAPRO) 10 MG tablet Take 1 tablet (10 mg total) by mouth daily. 30 tablet 1   No current facility-administered medications on file prior to visit.   Past Medical History:  Diagnosis Date   Anxiety    GERD (gastroesophageal reflux disease)    No Known Allergies  Social History   Socioeconomic History   Marital status: Married    Spouse name: Not on file   Number of children: Not on file   Years of education: Not on file   Highest education level: Not on file  Occupational History   Not on file  Tobacco Use   Smoking status: Never   Smokeless tobacco: Never  Substance and Sexual Activity   Alcohol use: No   Drug use: Not  Currently    Comment: Marijuana at age 67.   Sexual activity: Yes  Other Topics Concern   Not on file  Social History Narrative   Not on file   Social Determinants of Health   Financial Resource Strain: Not on file  Food Insecurity: Not on file  Transportation Needs: Not on file  Physical Activity: Not on file  Stress: Not on file  Social Connections: Not on file   Vitals:   07/24/21 1154  BP: (!) 180/90  Pulse: 90  Resp: 16  Temp: 98.3 F (36.8 C)  SpO2: 97%   Body mass index is 37.57 kg/m.  Physical Exam Nursing note reviewed.  Constitutional:      General: He is  not in acute distress.    Appearance: He is well-developed and well-groomed.  HENT:     Head: Normocephalic and atraumatic.     Mouth/Throat:     Mouth: Mucous membranes are moist.     Pharynx: Oropharynx is clear.  Eyes:     Conjunctiva/sclera: Conjunctivae normal.  Cardiovascular:     Rate and Rhythm: Normal rate and regular rhythm.     Pulses:          Posterior tibial pulses are 2+ on the right side and 2+ on the left side.     Heart sounds: No murmur heard. Pulmonary:     Effort: Pulmonary effort is normal. No respiratory distress.     Breath sounds: Normal breath sounds.  Abdominal:     Palpations: Abdomen is soft. There is no hepatomegaly or mass.     Tenderness: There is no abdominal tenderness.  Lymphadenopathy:     Cervical: No cervical adenopathy.  Skin:    General: Skin is warm.     Findings: No erythema or rash.  Neurological:     Mental Status: He is alert and oriented to person, place, and time.     Cranial Nerves: No cranial nerve deficit.     Gait: Gait normal.  Psychiatric:        Mood and Affect: Mood is anxious.   ASSESSMENT AND PLAN:   [name] was seen today for follow-up.  Diagnoses and all orders for this visit:  Insomnia, unspecified type Good sleep hygiene. Trazodone 25 mg at bedtime started today. We discussed the risk of med interaction.  -     traZODone (DESYREL) 50 MG tablet; Take 0.5 tablets (25 mg total) by mouth at bedtime as needed for sleep.  Essential (primary) hypertension Re-checked: 180/100. We discussed possible complications of elevated BP. Recommended BB, which may also help with anxiety. Declined treatment. He ascribed elevated BP to stress and hoping to better control BP once stress level goes down. Clearly instructed about warning signs.   Abnormal LFTs We discussed possible complications of cirrhosis, reporting stage 2. He is not interested in imaging or further work up at this time. Strongly recommend avoiding  alcohol intake.He does not think he needs to attend AA meetings.  GAD (generalized anxiety disorder) Improved but still not well controlled. We discussed options, including trying new med,increasing dose. He agrees with adding Trazodone 25 mg at bedtime. We discussed some side effects. Continue Lexapro 10 mg daily. CBT recommended.  Depression, major, single episode, severe (HCC) Improved. Continue Lexapro 10 mg daily. Trazodone 25 mg added. Instructed about warning signs.  I spent a total of 43 minutes in both face to face and non face to face activities for this visit on the date of this encounter.  During this time history was obtained and documented, examination was performed, prior labs/imaging reviewed, and assessment/plan discussed. He is overdue for colon cancer screening, not interested in doing so for now nor interested in vaccination.  Return in about 6 weeks (around 09/04/2021).  Babbie Dondlinger G. [place], MD  [facility]. [place] office.

## 2021-07-24 NOTE — Assessment & Plan Note (Signed)
Re-checked: 180/100. We discussed possible complications of elevated BP. Recommended BB, which may also help with anxiety. Declined treatment. He ascribed elevated BP to stress and hoping to better control BP once stress level goes down. Clearly instructed about warning signs.

## 2021-07-24 NOTE — Assessment & Plan Note (Signed)
Improved but still not well controlled. We discussed options, including trying new med,increasing dose. He agrees with adding Trazodone 25 mg at bedtime. We discussed some side effects. Continue Lexapro 10 mg daily. CBT recommended.

## 2021-07-24 NOTE — Assessment & Plan Note (Signed)
We discussed possible complications of cirrhosis, reporting stage 2. He is not interested in imaging or further work up at this time. Strongly recommend avoiding alcohol intake.He does not think he needs to attend AA meetings.

## 2021-07-24 NOTE — Patient Instructions (Signed)
A few things to remember from today's visit:  Insomnia, unspecified type  GAD (generalized anxiety disorder)  Depression, major, single episode, severe (HCC)  If you need refills please call your pharmacy. Do not use My Chart to request refills or for acute issues that need immediate attention.   Today we added Trazodone to help you asleep and helps with anxiety. No changes in Lexapro for now. Psychotherapy will also help, so consider establishing with therapist.  Monitor blood pressure, it was very high today. Blood pressure goal for most people is less than 140/90. Some populations (older than 60) the goal is less than 150/90.  Most recent cardiologists' recommendations recommend blood pressure at or less than 130/80.  Elevated blood pressure increases the risk of strokes, heart and kidney disease, and eye problems. Regular physical activity and a healthy diet (DASH diet) usually help. Low salt diet. Take medications as instructed.  Caution with some over the counter medications as cold medications, dietary products (for weight loss), and Ibuprofen or Aleve (frequent use);all these medications could cause elevation of blood pressure.  Please be sure medication list is accurate. If a new problem present, please set up appointment sooner than planned today.

## 2021-07-24 NOTE — Assessment & Plan Note (Addendum)
Improved. Continue Lexapro 10 mg daily. Trazodone 25 mg added. Instructed about warning signs. Will consider adding mood stabilizer if not greatly improved and still having insomnia.

## 2021-08-03 ENCOUNTER — Other Ambulatory Visit: Payer: Self-pay | Admitting: Family Medicine

## 2021-08-03 DIAGNOSIS — F322 Major depressive disorder, single episode, severe without psychotic features: Secondary | ICD-10-CM

## 2021-08-03 DIAGNOSIS — F411 Generalized anxiety disorder: Secondary | ICD-10-CM

## 2021-10-19 ENCOUNTER — Encounter: Payer: Self-pay | Admitting: Family Medicine

## 2021-10-19 ENCOUNTER — Ambulatory Visit (INDEPENDENT_AMBULATORY_CARE_PROVIDER_SITE_OTHER): Payer: BC Managed Care – PPO | Admitting: Family Medicine

## 2021-10-19 VITALS — BP 140/98 | HR 101 | Resp 16 | Ht 71.0 in | Wt 274.1 lb

## 2021-10-19 DIAGNOSIS — R002 Palpitations: Secondary | ICD-10-CM

## 2021-10-19 DIAGNOSIS — I10 Essential (primary) hypertension: Secondary | ICD-10-CM

## 2021-10-19 DIAGNOSIS — F5105 Insomnia due to other mental disorder: Secondary | ICD-10-CM | POA: Diagnosis not present

## 2021-10-19 DIAGNOSIS — F411 Generalized anxiety disorder: Secondary | ICD-10-CM

## 2021-10-19 DIAGNOSIS — F331 Major depressive disorder, recurrent, moderate: Secondary | ICD-10-CM

## 2021-10-19 DIAGNOSIS — R7989 Other specified abnormal findings of blood chemistry: Secondary | ICD-10-CM

## 2021-10-19 DIAGNOSIS — F99 Mental disorder, not otherwise specified: Secondary | ICD-10-CM

## 2021-10-19 MED ORDER — ALPRAZOLAM 0.25 MG PO TABS: 0.2500 mg | ORAL_TABLET | Freq: Every evening | ORAL | 0 refills | Status: DC | PRN

## 2021-10-19 MED ORDER — ESCITALOPRAM OXALATE 20 MG PO TABS: 20.0000 mg | ORAL_TABLET | Freq: Every day | ORAL | 0 refills | Status: DC

## 2021-10-19 MED ORDER — METOPROLOL SUCCINATE ER 25 MG PO TB24: 25.0000 mg | ORAL_TABLET | Freq: Every day | ORAL | 1 refills | Status: DC

## 2021-10-19 NOTE — Assessment & Plan Note (Signed)
We discussed possible etiologies. Alcohol intake can certainly exacerbate problem. Trazodone did not help,so he discontinued. Good sleep hygiene recommended. Xanax 0.25 mg started today, we discussed some side effect including addiction.

## 2021-10-19 NOTE — Assessment & Plan Note (Addendum)
Improved but still having depressed mood sometimes, reported like lack of motivation and fatigue. PHQ was 18 in 06/2021 when Lexapro was started and 6 today. He would like to increase dose of Lexapro, so he will increase from 10 mg to 20 mg daily. CBT may help and has been recommended in the past.

## 2021-10-19 NOTE — Assessment & Plan Note (Signed)
Lexapro dose increased from 10 mg to 20 mg daily. Xanax 0.25 mg to take at bedtime started today. We discussed some side effects of medications.

## 2021-10-19 NOTE — Assessment & Plan Note (Signed)
He understands the adverse effect of high alcohol intake. Liver US was recommended, declined. Wt loss will also help.

## 2021-10-19 NOTE — Assessment & Plan Note (Signed)
BP is not well controlled. Possible complications of elevated BP discussed. We discussed some treatment options, because he is also concerned about elevated HR, BB recommended. Metoprolol succinate 25 mg daily started today, side effects discussed. Low salt diet strongly recommended. Continue monitoring BP at home. Instructed about warning signs. Follow-up in 5-6 weeks,before if needed.

## 2021-10-19 NOTE — Progress Notes (Signed)
HPI: [name] is a 51 y.o. male, who is here today for follow up. He was last seen on 07/24/21. Still having difficulty falling asleep. Trazodone did not help, so he discontinued.  He drinks alcohol at night to help him relax and it seems to help some. Depression and anxiety, he is asking if Mark Gamble dose can be increased. He is on Mark Gamble 10 mg daily and it is helping but some days he feels not motivated and wants to stay in bed, no energy. Negative for suicidal thoughts. Lab Results  Component Value Date   TSH 0.95 06/09/2021   Depression screen Mark Gamble 2/9 10/19/2021 06/09/2021  Decreased Interest 1 3  Down, Depressed, Hopeless 1 2  PHQ - 2 Score 2 5  Altered sleeping 3 3  Tired, decreased energy 1 1  Change in appetite 0 2  Feeling bad or failure about yourself  0 3  Trouble concentrating 0 1  Moving slowly or fidgety/restless 0 2  Suicidal thoughts 0 1  PHQ-9 Score 6 18  Difficult doing work/chores Somewhat difficult Very difficult   He does not feel rested in the morning. No witnessed sleep apnea. If he drinks alcohol he snores.  Elevated transaminases. Did not have liver US done as recommended. He is still drinking alcohol at bedtime to help him asleep.  Lab Results  Component Value Date   ALT 131 (H) 06/09/2021   AST 136 (H) 06/09/2021   ALKPHOS 53 06/09/2021   BILITOT 0.5 06/09/2021   HTN: Home BP readings "high." 140-170's/90's. Negative for severe/frequent headache, visual changes, chest pain,focal weakness, or edema. Palpitations , can feel his HR when BP is elevated. He has not noted skipping beats. SOB with certain activities but "not always."  Lab Results  Component Value Date   CREATININE 1.10 06/09/2021   BUN 15 06/09/2021   NA 138 06/09/2021   K 4.3 06/09/2021   CL 103 06/09/2021   CO2 25 06/09/2021   Review of Systems  Constitutional:  Positive for fatigue. Negative for activity change, appetite change and fever.  HENT:  Negative for  nosebleeds and sore throat.   Respiratory:  Negative for cough and wheezing.   Gastrointestinal:  Negative for abdominal pain, nausea and vomiting.  Endocrine: Negative for cold intolerance and heat intolerance.  Genitourinary:  Negative for decreased urine volume and hematuria.  Neurological:  Negative for syncope, facial asymmetry and weakness.  Psychiatric/Behavioral:  Positive for sleep disturbance. Negative for confusion. The patient is nervous/anxious.   Rest see pertinent positives and negatives per HPI.  No current outpatient medications on file prior to visit.   No current facility-administered medications on file prior to visit.   Past Medical History:  Diagnosis Date   Anxiety    GERD (gastroesophageal reflux disease)    No Known Allergies  Social History   Socioeconomic History   Marital status: Married    Spouse name: Not on file   Number of children: Not on file   Years of education: Not on file   Highest education level: Not on file  Occupational History   Not on file  Tobacco Use   Smoking status: Never   Smokeless tobacco: Never  Substance and Sexual Activity   Alcohol use: No   Drug use: Not Currently    Comment: Marijuana at age 25.   Sexual activity: Yes  Other Topics Concern   Not on file  Social History Narrative   Not on file   Social Determinants of  Health   Financial Resource Strain: Not on file  Food Insecurity: Not on file  Transportation Needs: Not on file  Physical Activity: Not on file  Stress: Not on file  Social Connections: Not on file    Vitals:   10/19/21 1351  BP: (!) 140/98  Pulse: (!) 101  Resp: 16  SpO2: 97%   Body mass index is 38.23 kg/m.  Physical Exam Nursing note reviewed.  Constitutional:      General: He is not in acute distress.    Appearance: He is well-developed.  HENT:     Head: Normocephalic and atraumatic.  Eyes:     Conjunctiva/sclera: Conjunctivae normal.  Cardiovascular:     Rate and  Rhythm: Normal rate and regular rhythm.     Pulses:          Dorsalis pedis pulses are 2+ on the right side and 2+ on the left side.     Heart sounds: No murmur heard. Pulmonary:     Effort: Pulmonary effort is normal. No respiratory distress.     Breath sounds: Normal breath sounds.  Abdominal:     Palpations: Abdomen is soft. There is no hepatomegaly or mass.     Tenderness: There is no abdominal tenderness.  Lymphadenopathy:     Cervical: No cervical adenopathy.  Skin:    General: Skin is warm.     Findings: No erythema or rash.  Neurological:     Mental Status: He is alert and oriented to person, place, and time.     Cranial Nerves: No cranial nerve deficit.     Gait: Gait normal.  Psychiatric:        Mood and Affect: Mood is anxious.     Comments: Well groomed, good eye contact.   ASSESSMENT AND PLAN:  [name] was seen today for follow-up.  Diagnoses and all orders for this visit: Orders Placed This Encounter  Procedures   Basic metabolic panel   EKG [id number]   Lab Results  Component Value Date   CREATININE 1.04 10/19/2021   BUN 16 10/19/2021   NA 138 10/19/2021   K 4.3 10/19/2021   CL 103 10/19/2021   CO2 30 10/19/2021   Palpitations We discussed possible etiologies. Reporting HR 90's to 103/min. EKG today SR,normal axis, ? IVCD,no ST or T wave changes. No other EKG available for comparison. Metoprolol succinate added today for HTN. He is not interested in having sleep test. Instructed about warning signs. F/U in 5-6 weeks, before if needed.  GAD (generalized anxiety disorder) Mark Gamble dose increased from 10 mg to 20 mg daily. Xanax 0.25 mg to take at bedtime started today. We discussed some side effects of medications.   Depression, major, recurrent, moderate ([place]) Improved but still having depressed mood sometimes, reported like lack of motivation and fatigue. PHQ was 18 in 06/2021 when Mark Gamble was started and 6 today. He would like to increase dose  of Mark Gamble, so he will increase from 10 mg to 20 mg daily. CBT may help and has been recommended in the past.  Abnormal LFTs He understands the adverse effect of high alcohol intake. Liver US was recommended, declined. Wt loss will also help.  Insomnia due to other mental disorder We discussed possible etiologies. Alcohol intake can certainly exacerbate problem. Trazodone did not help,so he discontinued. Good sleep hygiene recommended. Xanax 0.25 mg started today, we discussed some side effect including addiction.  Essential (primary) hypertension BP is not well controlled. Possible complications of elevated BP discussed.  We discussed some treatment options, because he is also concerned about elevated HR, BB recommended. Metoprolol succinate 25 mg daily started today, side effects discussed. Low salt diet strongly recommended. Continue monitoring BP at home. Instructed about warning signs. Follow-up in 5-6 weeks,before if needed.  I spent a total of 45 minutes in both face to face and non face to face activities for this visit on the date of this encounter. During this time history was obtained and documented, examination was performed, prior labs reviewed, and assessment/plan discussed.  Return in about 6 weeks (around 11/30/2021) for Insomnia,depression,and HTN.  Maveryk Renstrom G. [place], MD  [facility]. [place] office.

## 2021-10-19 NOTE — Patient Instructions (Addendum)
A few things to remember from today's visit:  Essential (primary) hypertension - Plan: Basic metabolic panel, metoprolol succinate (TOPROL-XL) 25 MG 24 hr tablet  Elevated transaminase level  Palpitations - Plan: EKG 12-Lead  Depression, major, recurrent, moderate (HCC)  If you need refills please call your pharmacy. Do not use My Chart to request refills or for acute issues that need immediate attention.   Today Lexapro increased to 20 mg daily. Xanax added to help with sleep, take 1/2-1 tab at bedtime as needed.  Metoprolol succinate 25 mg daily added today. Continue monitoring blood pressure and pulse a few times per week.  Please be sure medication list is accurate. If a new problem present, please set up appointment sooner than planned today.

## 2021-10-20 LAB — BASIC METABOLIC PANEL
BUN: 16 mg/dL (ref 6–23)
CO2: 30 mEq/L (ref 19–32)
Calcium: 9.7 mg/dL (ref 8.4–10.5)
Chloride: 103 mEq/L (ref 96–112)
Creatinine, Ser: 1.04 mg/dL (ref 0.40–1.50)
GFR: 82.94 mL/min (ref >60.00)
Glucose, Bld: 79 mg/dL (ref 70–99)
Potassium: 4.3 mEq/L (ref 3.5–5.1)
Sodium: 138 mEq/L (ref 135–145)

## 2021-11-02 ENCOUNTER — Encounter: Payer: Self-pay | Admitting: Family Medicine

## 2021-11-02 ENCOUNTER — Ambulatory Visit: Payer: BC Managed Care – PPO | Admitting: Family Medicine

## 2021-11-02 VITALS — BP 160/102 | HR 92 | Resp 16 | Ht 71.0 in | Wt 272.4 lb

## 2021-11-02 DIAGNOSIS — I10 Essential (primary) hypertension: Secondary | ICD-10-CM | POA: Diagnosis not present

## 2021-11-02 DIAGNOSIS — F411 Generalized anxiety disorder: Secondary | ICD-10-CM

## 2021-11-02 DIAGNOSIS — F99 Mental disorder, not otherwise specified: Secondary | ICD-10-CM | POA: Diagnosis not present

## 2021-11-02 DIAGNOSIS — F5105 Insomnia due to other mental disorder: Secondary | ICD-10-CM | POA: Diagnosis not present

## 2021-11-02 MED ORDER — OLMESARTAN MEDOXOMIL 20 MG PO TABS: 20.0000 mg | ORAL_TABLET | Freq: Every day | ORAL | 1 refills | Status: DC

## 2021-11-02 MED ORDER — METOPROLOL SUCCINATE ER 50 MG PO TB24: 50.0000 mg | ORAL_TABLET | Freq: Every day | ORAL | 0 refills | Status: DC

## 2021-11-02 NOTE — Assessment & Plan Note (Signed)
BP is not well controlled. Possible complications of elevated BP discussed. Changes today: Metoprolol succinate increased from 25 mg to 50 mg and Benicar 20 mg added today. We discussed side effects of medications. Low salt diet to continue. Continue monitoring BP at home, instructed to let me know about BP readings in 10 to 14 days. BMP in 7 to 10 days. Instructed about warning signs. Follow-up in 6 weeks.

## 2021-11-02 NOTE — Patient Instructions (Addendum)
A few things to remember from today's visit:  Essential (primary) hypertension - Plan: olmesartan (BENICAR) 20 MG tablet, metoprolol succinate (TOPROL-XL) 50 MG 24 hr tablet  GAD (generalized anxiety disorder)  If you need refills please call your pharmacy. Do not use My Chart to request refills or for acute issues that need immediate attention.   Today we increased Metoprolol from 25 mg to 50 mg and added Benicar 20 mg. Take one at night and the other in the morning.  Lab in 7-10 days.  Blood pressure reading in 10-14 days.  Please be sure medication list is accurate. If a new problem present, please set up appointment sooner than planned today.

## 2021-11-02 NOTE — Progress Notes (Signed)
ACUTE VISIT Chief Complaint  Patient presents with   Hypertension   HPI: Mark Gamble is a 51 y.o. male, who is here today complaining of persistent elevated BP's. Mark Gamble was last seen on 10/19/21, when Mark Gamble is on Metoprolol succinate 25 mg daily was started. BP readings: 191/117,234/119,and 174/109.  Hypertension This is a chronic problem. Associated symptoms include anxiety. Pertinent negatives include no blurred vision, chest pain, headaches, malaise/fatigue, neck pain, orthopnea, palpitations, peripheral edema, PND, shortness of breath or sweats. Risk factors for coronary artery disease include male gender, sedentary lifestyle, dyslipidemia and stress. Past treatments include beta blockers. The current treatment provides no improvement. There is no history of angina, kidney disease, CAD/MI or CVA.  Lab Results  Component Value Date   CREATININE 1.04 10/19/2021   BUN 16 10/19/2021   NA 138 10/19/2021   K 4.3 10/19/2021   CL 103 10/19/2021   CO2 30 10/19/2021   Anxiety: Mark Gamble is on Lexapro 20 mg daily and last visit Xanax 0.25 mg was added to help with anxiety and sleep. 1 tab did not help, Mark Gamble has taken 2 tabs a couple times since his last visit and it has helped him sleep and "calm" him down. Mark Gamble wakes up in the morning with no anxiety.  No side effects reported.  Review of Systems  Constitutional:  Negative for chills, fever and malaise/fatigue.  Eyes:  Negative for blurred vision.  Respiratory:  Negative for cough, shortness of breath and wheezing.   Cardiovascular:  Negative for chest pain, palpitations, orthopnea and PND.  Gastrointestinal:  Negative for abdominal pain, nausea and vomiting.  Endocrine: Negative for cold intolerance and heat intolerance.  Genitourinary:  Negative for decreased urine volume and hematuria.  Musculoskeletal:  Negative for neck pain.  Neurological:  Negative for headaches.  Rest see pertinent positives and negatives per HPI.  Current Outpatient  Medications on File Prior to Visit  Medication Sig Dispense Refill   ALPRAZolam (XANAX) 0.25 MG tablet Take 1 tablet (0.25 mg total) by mouth at bedtime as needed for anxiety. 20 tablet 0   escitalopram (LEXAPRO) 20 MG tablet Take 1 tablet (20 mg total) by mouth daily. 90 tablet 0   No current facility-administered medications on file prior to visit.   Past Medical History:  Diagnosis Date   Anxiety    GERD (gastroesophageal reflux disease)    No Known Allergies  Social History   Socioeconomic History   Marital status: Married    Spouse name: Not on file   Number of children: Not on file   Years of education: Not on file   Highest education level: Not on file  Occupational History   Not on file  Tobacco Use   Smoking status: Never   Smokeless tobacco: Never  Substance and Sexual Activity   Alcohol use: No   Drug use: Not Currently    Comment: Marijuana at age 65.   Sexual activity: Yes  Other Topics Concern   Not on file  Social History Narrative   Not on file   Social Determinants of Health   Financial Resource Strain: Not on file  Food Insecurity: Not on file  Transportation Needs: Not on file  Physical Activity: Not on file  Stress: Not on file  Social Connections: Not on file   Vitals:   11/02/21 1126  BP: (!) 160/102  Pulse: 92  Resp: 16  SpO2: 97%   Body mass index is 37.99 kg/m.  Physical Exam Vitals and nursing note  reviewed.  Constitutional:      General: Mark Gamble is not in acute distress.    Appearance: Mark Gamble is well-developed.  HENT:     Head: Normocephalic and atraumatic.     Mouth/Throat:     Mouth: Mucous membranes are moist.  Eyes:     Conjunctiva/sclera: Conjunctivae normal.  Cardiovascular:     Rate and Rhythm: Normal rate and regular rhythm.     Pulses:          Posterior tibial pulses are 2+ on the right side and 2+ on the left side.     Heart sounds: No murmur heard.    Comments: Trace pitting LE edema, bilateral. Pulmonary:      Effort: Pulmonary effort is normal. No respiratory distress.     Breath sounds: Normal breath sounds.  Abdominal:     Palpations: Abdomen is soft. There is no hepatomegaly or mass.     Tenderness: There is no abdominal tenderness.  Lymphadenopathy:     Cervical: No cervical adenopathy.  Skin:    General: Skin is warm.     Findings: No erythema or rash.  Neurological:     General: No focal deficit present.     Mental Status: Mark Gamble is alert and oriented to person, place, and time.     Cranial Nerves: No cranial nerve deficit.     Gait: Gait normal.  Psychiatric:        Mood and Affect: Mood is anxious.     Comments: Well groomed, good eye contact.   ASSESSMENT AND PLAN:  [name] was seen today for hypertension.  Diagnoses and all orders for this visit: Orders Placed This Encounter  Procedures   Basic metabolic panel    GAD (generalized anxiety disorder) Problem has improved. Continue Lexapro 20 mg daily and Xanax 0.5 mg at bedtime daily as needed.  Essential (primary) hypertension BP is not well controlled. Possible complications of elevated BP discussed. Changes today: Metoprolol succinate increased from 25 mg to 50 mg and Benicar 20 mg added today. We discussed side effects of medications. Low salt diet to continue. Continue monitoring BP at home, instructed to let me know about BP readings in 10 to 14 days. BMP in 7 to 10 days. Instructed about warning signs. Follow-up in 6 weeks.   Insomnia due to other mental disorder Xanax 0.25 mg 2 tabs at bedtime as needed has helped. Adequate sleep hygiene to continue.   Return in about 6 weeks (around 12/14/2021) for Labs in 7-10 days..  Walter Min G. [place], MD  [facility]. [place] office.

## 2021-11-02 NOTE — Assessment & Plan Note (Signed)
Xanax 0.25 mg 2 tabs at bedtime as needed has helped. Adequate sleep hygiene to continue.

## 2021-11-02 NOTE — Assessment & Plan Note (Signed)
Problem has improved. Continue Lexapro 20 mg daily and Xanax 0.5 mg at bedtime daily as needed.

## 2021-11-24 ENCOUNTER — Other Ambulatory Visit: Payer: Self-pay | Admitting: Family Medicine

## 2021-11-24 DIAGNOSIS — F411 Generalized anxiety disorder: Secondary | ICD-10-CM

## 2021-11-24 DIAGNOSIS — F322 Major depressive disorder, single episode, severe without psychotic features: Secondary | ICD-10-CM

## 2021-11-24 DIAGNOSIS — G4700 Insomnia, unspecified: Secondary | ICD-10-CM

## 2022-01-01 ENCOUNTER — Other Ambulatory Visit: Payer: Self-pay | Admitting: Family Medicine

## 2022-01-01 ENCOUNTER — Telehealth: Payer: Self-pay

## 2022-01-01 DIAGNOSIS — I10 Essential (primary) hypertension: Secondary | ICD-10-CM

## 2022-01-01 MED ORDER — OLMESARTAN MEDOXOMIL 20 MG PO TABS: ORAL_TABLET | ORAL | 2 refills | Status: DC

## 2022-01-01 NOTE — Telephone Encounter (Signed)
Pharmacy sent a fax saying insurance would cover 90 days, Rx sent. ?

## 2022-01-01 NOTE — Telephone Encounter (Signed)
Olmesartan is not covered by patient's insurance.  ?

## 2022-01-01 NOTE — Addendum Note (Signed)
Addended by: Kathreen Devoid on: 01/01/2022 04:16 PM ? ? Modules accepted: Orders ? ?

## 2022-01-01 NOTE — Telephone Encounter (Signed)
Pt calling to check to see if we have received request from pharmacy.  ?

## 2022-01-21 ENCOUNTER — Other Ambulatory Visit: Payer: Self-pay | Admitting: Family Medicine

## 2022-01-21 DIAGNOSIS — F322 Major depressive disorder, single episode, severe without psychotic features: Secondary | ICD-10-CM

## 2022-01-21 DIAGNOSIS — F411 Generalized anxiety disorder: Secondary | ICD-10-CM

## 2022-01-22 ENCOUNTER — Telehealth: Payer: Self-pay | Admitting: Family Medicine

## 2022-01-22 DIAGNOSIS — F331 Major depressive disorder, recurrent, moderate: Secondary | ICD-10-CM

## 2022-01-22 MED ORDER — ESCITALOPRAM OXALATE 20 MG PO TABS: 20.0000 mg | ORAL_TABLET | Freq: Every day | ORAL | 2 refills | Status: DC

## 2022-01-22 NOTE — Telephone Encounter (Signed)
Pt requesting a refill of escitalopram (LEXAPRO) 20 MG tablet states he only has 1 pill left and does not want to go the weekend without for fear of withdrawals.

## 2022-02-07 ENCOUNTER — Other Ambulatory Visit: Payer: Self-pay | Admitting: Family Medicine

## 2022-02-07 DIAGNOSIS — I10 Essential (primary) hypertension: Secondary | ICD-10-CM

## 2022-05-27 ENCOUNTER — Other Ambulatory Visit: Payer: Self-pay | Admitting: Family Medicine

## 2022-05-27 DIAGNOSIS — I10 Essential (primary) hypertension: Secondary | ICD-10-CM

## 2022-07-15 ENCOUNTER — Telehealth: Payer: Self-pay | Admitting: Family Medicine

## 2022-07-15 NOTE — Telephone Encounter (Signed)
Pt requesting a letter stating he is being treated for hypertension by Dr [place]. Requests call to explain

## 2022-07-16 ENCOUNTER — Encounter: Payer: Self-pay | Admitting: Family Medicine

## 2022-07-16 NOTE — Telephone Encounter (Signed)
I called and spoke with patient. He is needing a letter stating that he is under PCP's care for his hypertension management along with the medication he takes. Letter created for patient & put up front for his wife to pick up today.

## 2022-12-07 NOTE — Progress Notes (Signed)
ACUTE VISIT Chief Complaint  Patient presents with   Hypertension   Shortness of Breath   HPI: [name] is a 52 y.o. male, who is here today complaining of elevated BP and shortness of breath, both problem has been going on since 2022 and gradually getting worse. He was last seen in 10/2021.  Hypertension This is a chronic problem. The problem is uncontrolled. Associated symptoms include anxiety, headaches, malaise/fatigue, palpitations, shortness of breath and sweats. Pertinent negatives include no blurred vision, chest pain, neck pain, orthopnea, peripheral edema or PND. Risk factors for coronary artery disease include male gender, dyslipidemia and sedentary lifestyle. Past treatments include beta blockers and angiotensin blockers. The current treatment provides no improvement. There is no history of angina, kidney disease, CVA or heart failure.   He states that elevated BP has persisted despite medication changes over the past couple of years.  Until summer 2023, he was on olmesartan 20 mg daily and metoprolol succinate 50 mg daily. He reports that while Metoprolol effectively managed his elevated heart rate, it did not impact his blood pressure levels. he has discontinued all medications, including Olmesartan, since last summer.  A significant concern for him is his persistent shortness of breath, which has been present since his initial visit two years ago and has progressively worsened. He reports difficulty performing simple tasks without becoming "winded and sweaty", though he denies experiencing chest pain. He also notes an elevated heart rate, which he can feel, especially during quiet moments.He is not sure about max HR.  He mentions that last year took 2 sublingual nitroglycerin before having DOT physical done back did not find this effective in controlling his BP. Negative for witnessed  sleep apnea.  Lab Results  Component Value Date   CREATININE 1.04 10/19/2021   BUN  16 10/19/2021   NA 138 10/19/2021   K 4.3 10/19/2021   CL 103 10/19/2021   CO2 30 10/19/2021   Lab Results  Component Value Date   TSH 0.95 06/09/2021   Lab Results  Component Value Date   WBC 10.2 06/09/2021   HGB 14.4 06/09/2021   HCT 42.4 06/09/2021   MCV 93.2 06/09/2021   PLT 183.0 06/09/2021   -Anxiety, insomnia, and depression: He describes experiencing severe side effects during the initial two weeks of taking Lexapro, which subsequently subsided, leading to improved mood and reduced anger. However, he discontinued the medication last summer. He mentions is taking Xanax 0.25 mg for anxiety attacks on an as-needed basis, with only one pill remaining from a prescription filled 14 months ago. He believes the medication is effective for severe anxiety episodes. His mental health has been impacted by recent life events, including his company's bankruptcy, leading to worsening depression.  -Vitamin D deficiency: Last 30 OH vitamin D in 06/2021 was 19.2.  He is not on vitamin D supplementation.  He reports a hospitalization for heat stroke while working in [place] last summer after episode of syncope. He is reporting normal cardiac workup, he thinks he had an EKG and an echo. He has an appointment with Dr. Tenny Craw in a few days, 12/23/2022.  He is seeking medication to manage his blood pressure and depression but is concerned about potential side effects, particularly drowsiness, which could impact his ability to work. He is a [employer]. 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  -He reports experiencing severe headaches after eating, which have been occurring daily for the past 7 to 10 days.  No associated  visual changes, nausea, vomiting, photophobia, or focal neurologic deficit. He is taking Excedrin Migraine daily.  -Insomnia: Regarding his sleep patterns, he notes significant variability in sleep duration and quality, often resorting to Benadryl to aid sleep.   Sleeps from 0 to 2 hours nightly.  -History of elevated transaminases. Liver US done in 10/2019, which showed liver changes suggestive of possible cirrhosis, no focal lesions.Followed with GI , Dr Elnoria Howard. Negative for abdominal pain, nausea, skin pruritus, or jaundice.  Review of Systems  Constitutional:  Positive for malaise/fatigue.  HENT:  Positive for postnasal drip and rhinorrhea. Negative for congestion, sore throat and trouble swallowing.   Eyes:  Negative for blurred vision.  Respiratory:  Positive for shortness of breath. Negative for cough and wheezing.   Cardiovascular:  Positive for palpitations. Negative for chest pain, orthopnea, leg swelling and PND.  Gastrointestinal:  Negative for abdominal pain, nausea and vomiting.  Endocrine: Negative for cold intolerance and heat intolerance.  Genitourinary:  Negative for decreased urine volume, dysuria and hematuria.  Musculoskeletal:  Negative for gait problem and neck pain.  Skin:  Negative for rash.  Allergic/Immunologic: Positive for environmental allergies.  Neurological:  Positive for headaches. Negative for tremors, seizures and speech difficulty.  Psychiatric/Behavioral:  Positive for sleep disturbance. Negative for confusion and hallucinations. The patient is nervous/anxious.   See other pertinent positives and negatives in HPI.  Current Outpatient Medications on File Prior to Visit  Medication Sig Dispense Refill   ALPRAZolam (XANAX) 0.25 MG tablet Take 1 tablet (0.25 mg total) by mouth at bedtime as needed for anxiety. 20 tablet 0   No current facility-administered medications on file prior to visit.   Past Medical History:  Diagnosis Date   Anxiety    GERD (gastroesophageal reflux disease)    No Known Allergies  Social History   Socioeconomic History   Marital status: Married    Spouse name: Not on file   Number of children: Not on file   Years of education: Not on file   Highest education level: Not on file   Occupational History   Not on file  Tobacco Use   Smoking status: Never   Smokeless tobacco: Never  Substance and Sexual Activity   Alcohol use: No   Drug use: Not Currently    Comment: Marijuana at age 38.   Sexual activity: Yes  Other Topics Concern   Not on file  Social History Narrative   Not on file   Social Determinants of Health   Financial Resource Strain: Not on file  Food Insecurity: Not on file  Transportation Needs: Not on file  Physical Activity: Not on file  Stress: Not on file  Social Connections: Not on file   Vitals:   12/10/22 1149 12/10/22 1244  BP: (!) 184/100 (!) 165/105  Pulse: (!) 116   Resp: 12   Temp: 98.2 F (36.8 C)   SpO2: 98%    Body mass index is 37.13 kg/m.  Physical Exam Vitals and nursing note reviewed.  Constitutional:      General: He is not in acute distress.    Appearance: He is well-developed.  HENT:     Head: Normocephalic and atraumatic.     Mouth/Throat:     Mouth: Mucous membranes are moist.     Pharynx: Oropharynx is clear.  Eyes:     Conjunctiva/sclera: Conjunctivae normal.  Cardiovascular:     Rate and Rhythm: Normal rate and regular rhythm.     Pulses:  Dorsalis pedis pulses are 2+ on the right side and 2+ on the left side.     Heart sounds: No murmur heard. Pulmonary:     Effort: Pulmonary effort is normal. No respiratory distress.     Breath sounds: Normal breath sounds.  Abdominal:     Palpations: Abdomen is soft. There is no hepatomegaly or mass.     Tenderness: There is no abdominal tenderness.  Lymphadenopathy:     Cervical: No cervical adenopathy.  Skin:    General: Skin is warm.     Findings: No erythema or rash.  Neurological:     General: No focal deficit present.     Mental Status: He is alert and oriented to person, place, and time.     Cranial Nerves: No cranial nerve deficit.     Gait: Gait normal.     Deep Tendon Reflexes:     Reflex Scores:      Bicep reflexes are 2+ on the  right side and 2+ on the left side.      Patellar reflexes are 2+ on the right side and 2+ on the left side. Psychiatric:        Mood and Affect: Affect normal. Mood is anxious.   ASSESSMENT AND PLAN:  Mr. Ventura Sellerseylon was seen today for elevated BP and to address other multiple concerns. Lab Results  Component Value Date   CREATININE 0.96 12/10/2022   BUN 15 12/10/2022   NA 137 12/10/2022   K 4.0 12/10/2022   CL 103 12/10/2022   CO2 23 12/10/2022   Lab Results  Component Value Date   ALT 57 (H) 12/10/2022   AST 43 (H) 12/10/2022   ALKPHOS 51 12/10/2022   BILITOT 0.4 12/10/2022   Lab Results  Component Value Date   WBC 5.7 12/10/2022   HGB 16.0 12/10/2022   HCT 46.2 12/10/2022   MCV 90.0 12/10/2022   PLT 224.0 12/10/2022   Lab Results  Component Value Date   TSH 1.12 12/10/2022   Dyspnea on exertion Assessment & Plan: Problem has been going on for at least 2 years, gradually getting worse. We discussed possible etiologies. He has an appointment with cardiologist in a few days. Instructed about warning signs.   Orders: -     Ambulatory referral to Cardiology  Abnormal LFTs Assessment & Plan: Problem is chronic, he has reported history of cirrhosis, he has followed with GI. Liver US was ordered in 06/2021, he did not feel like it was necessary. He had liver US in 10/2019, which showed no coarse appendix echotexture with possible mild contour irregularity, suggesting possible cirrhosis.  No focal lesions were appreciated. Further recommendation will be given according to LFT result.  Orders: -     Hepatic function panel; Future  Depression, major, recurrent, moderate Assessment & Plan: We discussed a few options, some of which may also help with insomnia like mirtazapine.  After discussing side effects, he prefers not to start this medication, he would like to try Lexapro again He felt like Lexapro helped, initially it caused some drowsiness but resolved after 2  weeks of taking medication. Lexapro 20 mg daily resumed today. We could consider adding Abilify 2 mg daily if problem is still not well-controlled to treat possible mood disorder. Follow-up in 2 months, before if needed.   Orders: -     Escitalopram Oxalate; Take 1 tablet (20 mg total) by mouth daily.  Dispense: 90 tablet; Refill: 0  Insomnia due to other mental disorder  Assessment & Plan: Good sleep hygiene recommended. We discussed some options, including mirtazapine, because of risk of drowsiness the next day he prefers to hold on this medication. Lexapro received today to manage anxiety may help.   Sinus tachycardia Assessment & Plan: He does not want to resume metoprolol or other medication that can potentially cause drowsiness. Continue monitoring HR regularly. He has an appointment with cardiologist on 12/23/2022. Instructed about warning signs. Further recommendation will be given according to lab results.  Orders: -     Ambulatory referral to Cardiology -     TSH; Future  Vitamin D deficiency, unspecified Assessment & Plan: Currently he is not on vitamin D supplementation. Further recommendation will be given according to 25 OH vitamin D result.  Orders: -     VITAMIN D 25 Hydroxy (Vit-D Deficiency, Fractures); Future  Frontal headache Assessment & Plan: We discussed possible causes, including allergies, hypertension, tension headache, insomnia/anxiety related among some. I do not think imaging is needed at this time. Tylenol 500 mg once daily or continue Excedrin Migraine once daily as needed. He was clearly instructed about warning signs. Follow-up in 2 months, before if needed.   Hypertensive urgency Assessment & Plan: He is reporting that problem has not been well-controlled since his last visit, reporting similar BP readings at home.  Currently he is not on pharmacologic treatment. We discussed possible complications of elevated BP. He has an appointment  with cardiologist on 12/23/2022. He does not want to resume metoprolol succinate because of potential side effects, drowsiness mainly. He agrees with trying Amlodipine-olmesartan 5-20 mg daily. Continue monitoring BP regularly. He was clearly instructed about warning signs. 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 Follow-up in 2 months.  Orders: -     Ambulatory referral to Cardiology -     Aldosterone + renin activity w/ ratio -     Basic metabolic panel; Future -     CBC; Future -     TSH; Future  GAD (generalized anxiety disorder) Assessment & Plan: Problem is not well-controlled. For now I am not going to refill alprazolam, which was prescribed last in 10/2021. He would like to resume Lexapro at 20 mg daily. Recommend CBT, he is not interested at this time due to time and cost. Follow-up in 2 months, before if needed.   I spent a total of 61 minutes in both face to face and non face to face activities for this visit on the date of this encounter. During this time history was obtained and documented, examination was performed, prior labs/imaging reviewed, and assessment/plan discussed.  Return in about 2 months (around 02/09/2023).  Ashlay Altieri G. [place], MD  [facility]. [place] office.

## 2022-12-10 ENCOUNTER — Other Ambulatory Visit: Payer: Self-pay | Admitting: Family Medicine

## 2022-12-10 ENCOUNTER — Encounter: Payer: Self-pay | Admitting: Family Medicine

## 2022-12-10 ENCOUNTER — Ambulatory Visit: Payer: 59 | Admitting: Family Medicine

## 2022-12-10 VITALS — BP 165/105 | HR 116 | Temp 98.2°F | Resp 12 | Ht 71.0 in | Wt 266.2 lb

## 2022-12-10 DIAGNOSIS — R000 Tachycardia, unspecified: Secondary | ICD-10-CM | POA: Diagnosis not present

## 2022-12-10 DIAGNOSIS — R7989 Other specified abnormal findings of blood chemistry: Secondary | ICD-10-CM

## 2022-12-10 DIAGNOSIS — F5105 Insomnia due to other mental disorder: Secondary | ICD-10-CM

## 2022-12-10 DIAGNOSIS — I10 Essential (primary) hypertension: Secondary | ICD-10-CM

## 2022-12-10 DIAGNOSIS — I160 Hypertensive urgency: Secondary | ICD-10-CM | POA: Diagnosis not present

## 2022-12-10 DIAGNOSIS — F411 Generalized anxiety disorder: Secondary | ICD-10-CM

## 2022-12-10 DIAGNOSIS — R519 Headache, unspecified: Secondary | ICD-10-CM | POA: Insufficient documentation

## 2022-12-10 DIAGNOSIS — E559 Vitamin D deficiency, unspecified: Secondary | ICD-10-CM

## 2022-12-10 DIAGNOSIS — R0609 Other forms of dyspnea: Secondary | ICD-10-CM | POA: Diagnosis not present

## 2022-12-10 DIAGNOSIS — F331 Major depressive disorder, recurrent, moderate: Secondary | ICD-10-CM | POA: Diagnosis not present

## 2022-12-10 DIAGNOSIS — F99 Mental disorder, not otherwise specified: Secondary | ICD-10-CM

## 2022-12-10 LAB — TSH: TSH: 1.12 u[IU]/mL (ref 0.35–5.50)

## 2022-12-10 LAB — BASIC METABOLIC PANEL
BUN: 15 mg/dL (ref 6–23)
CO2: 23 mEq/L (ref 19–32)
Calcium: 9.9 mg/dL (ref 8.4–10.5)
Chloride: 103 mEq/L (ref 96–112)
Creatinine, Ser: 0.96 mg/dL (ref 0.40–1.50)
GFR: 90.57 mL/min (ref >60.00)
Glucose, Bld: 116 mg/dL — ABNORMAL HIGH (ref 70–99)
Potassium: 4 mEq/L (ref 3.5–5.1)
Sodium: 137 mEq/L (ref 135–145)

## 2022-12-10 LAB — CBC
HCT: 46.2 % (ref 39.0–52.0)
Hemoglobin: 16 g/dL (ref 13.0–17.0)
MCHC: 34.6 g/dL (ref 30.0–36.0)
MCV: 90 fl (ref 78.0–100.0)
Platelets: 224 10*3/uL (ref 150.0–400.0)
RBC: 5.13 Mil/uL (ref 4.22–5.81)
RDW: 14.2 % (ref 11.5–15.5)
WBC: 5.7 10*3/uL (ref 4.0–10.5)

## 2022-12-10 LAB — HEPATIC FUNCTION PANEL
ALT: 57 U/L — ABNORMAL HIGH (ref 0–53)
AST: 43 U/L — ABNORMAL HIGH (ref 0–37)
Albumin: 4.8 g/dL (ref 3.5–5.2)
Alkaline Phosphatase: 51 U/L (ref 39–117)
Bilirubin, Direct: 0.1 mg/dL (ref 0.0–0.3)
Total Bilirubin: 0.4 mg/dL (ref 0.2–1.2)
Total Protein: 8 g/dL (ref 6.0–8.3)

## 2022-12-10 LAB — VITAMIN D 25 HYDROXY (VIT D DEFICIENCY, FRACTURES): VITD: 22.83 ng/mL — ABNORMAL LOW (ref 30.00–100.00)

## 2022-12-10 MED ORDER — ESCITALOPRAM OXALATE 20 MG PO TABS: 20.0000 mg | ORAL_TABLET | Freq: Every day | ORAL | 0 refills | Status: DC

## 2022-12-10 MED ORDER — AMLODIPINE-OLMESARTAN 5-20 MG PO TABS: 1.0000 | ORAL_TABLET | Freq: Every day | ORAL | 1 refills | Status: DC

## 2022-12-10 NOTE — Assessment & Plan Note (Signed)
He does not want to resume metoprolol or other medication that can potentially cause drowsiness. Continue monitoring HR regularly. He has an appointment with cardiologist on 12/23/2022. Instructed about warning signs. Further recommendation will be given according to lab results.

## 2022-12-10 NOTE — Assessment & Plan Note (Signed)
Problem is not well-controlled. For now I am not going to refill alprazolam, which was prescribed last in 10/2021. He would like to resume Lexapro at 20 mg daily. Recommend CBT, he is not interested at this time due to time and cost. Follow-up in 2 months, before if needed.

## 2022-12-10 NOTE — Assessment & Plan Note (Signed)
We discussed possible causes, including allergies, hypertension, tension headache, insomnia/anxiety related among some. I do not think imaging is needed at this time. Tylenol 500 mg once daily or continue Excedrin Migraine once daily as needed. He was clearly instructed about warning signs. Follow-up in 2 months, before if needed.

## 2022-12-10 NOTE — Assessment & Plan Note (Signed)
Problem has been going on for at least 2 years, gradually getting worse. We discussed possible etiologies. He has an appointment with cardiologist in a few days. Instructed about warning signs.

## 2022-12-10 NOTE — Assessment & Plan Note (Addendum)
Good sleep hygiene recommended. We discussed some options, including mirtazapine, because of risk of drowsiness the next day he prefers to hold on this medication. Lexapro received today to manage anxiety may help.

## 2022-12-10 NOTE — Assessment & Plan Note (Addendum)
He is reporting that problem has not been well-controlled since his last visit, reporting similar BP readings at home.  Currently he is not on pharmacologic treatment. We discussed possible complications of elevated BP. He has an appointment with cardiologist on 12/23/2022. He does not want to resume metoprolol succinate because of potential side effects, drowsiness mainly. He agrees with trying Amlodipine-olmesartan 5-20 mg daily. Continue monitoring BP regularly. He was clearly instructed about warning signs. 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 Follow-up in 2 months.

## 2022-12-10 NOTE — Assessment & Plan Note (Signed)
Currently he is not on vitamin D supplementation. °Further recommendation will be given according to 25 OH vitamin D result. °

## 2022-12-10 NOTE — Assessment & Plan Note (Signed)
Problem is chronic, he has reported history of cirrhosis, he has followed with GI. Liver US was ordered in 06/2021, he did not feel like it was necessary. He had liver US in 10/2019, which showed no coarse appendix echotexture with possible mild contour irregularity, suggesting possible cirrhosis.  No focal lesions were appreciated. Further recommendation will be given according to LFT result.

## 2022-12-10 NOTE — Patient Instructions (Addendum)
A few things to remember from today's visit:  Essential (primary) hypertension - Plan: Ambulatory referral to Cardiology, Aldosterone + renin activity w/ ratio, Basic metabolic panel, CBC, TSH, amLODipine-olmesartan (AZOR) 5-20 MG tablet  Abnormal LFTs - Plan: Hepatic function panel  Depression, major, recurrent, moderate - Plan: escitalopram (LEXAPRO) 20 MG tablet  Insomnia due to other mental disorder  Sinus tachycardia - Plan: Ambulatory referral to Cardiology, TSH  Dyspnea on exertion - Plan: Ambulatory referral to Cardiology  Vitamin D deficiency, unspecified - Plan: VITAMIN D 25 Hydroxy (Vit-D Deficiency, Fractures)  Frontal headache  Resume Lexapro 10 mg daily. Amlodipine-Olmesartan 5-20 mg daily started today. If any worsening headache,chest pain,or blood pressure 200/110 or above you have to seek immediate medical attention. Tylenol 500 mg or continue Excedrin for headache, may improve once blood pressure is well controlled and with better sleep.  Continue monitoring blood pressure. Keep appt with Dr Tenny Craw.  If you need refills for medications you take chronically, please call your pharmacy. Do not use My Chart to request refills or for acute issues that need immediate attention. If you send a my chart message, it may take a few days to be addressed, specially if I am not in the office.  Please be sure medication list is accurate. If a new problem present, please set up appointment sooner than planned today.

## 2022-12-10 NOTE — Assessment & Plan Note (Signed)
We discussed a few options, some of which may also help with insomnia like mirtazapine.  After discussing side effects, he prefers not to start this medication, he would like to try Lexapro again He felt like Lexapro helped, initially it caused some drowsiness but resolved after 2 weeks of taking medication. Lexapro 20 mg daily resumed today. We could consider adding Abilify 2 mg daily if problem is still not well-controlled to treat possible mood disorder. Follow-up in 2 months, before if needed.

## 2022-12-13 ENCOUNTER — Telehealth: Payer: Self-pay | Admitting: Family Medicine

## 2022-12-13 DIAGNOSIS — F331 Major depressive disorder, recurrent, moderate: Secondary | ICD-10-CM

## 2022-12-13 NOTE — Telephone Encounter (Signed)
Requesting a referral to behavioral health, patient is open to see Mellody Dance is there are openings this week, otherwise requesting a male psychiatrist.

## 2022-12-13 NOTE — Telephone Encounter (Signed)
Referral placed for pt to see Mellody Dance.

## 2022-12-17 LAB — ALDOSTERONE + RENIN ACTIVITY W/ RATIO
ALDO / PRA Ratio: 1.9 Ratio (ref 0.9–28.9)
Aldosterone: 7 ng/dL
Renin Activity: 3.61 ng/mL/h (ref 0.25–5.82)

## 2022-12-20 ENCOUNTER — Telehealth: Payer: Self-pay | Admitting: Family Medicine

## 2022-12-20 NOTE — Telephone Encounter (Signed)
I called and spoke with patient's wife. He is having shortness of breath & palpitations which is not new for him. He has appointment with cardiology for Friday. I spoke with pcp, patient to continue medications and will need to go to the hospital if having any increased shortness of breath, heart palpitations with chest pain, or lightheadedness. Patient's wife verbalized understanding and will call back if anything changes prior to Cardiology appointment.   Also set up patient's wife for new patient appointment with Dr. Casimiro Needle on 5/2.

## 2022-12-20 NOTE — Telephone Encounter (Signed)
Pt wife call and stated he is having reaction to lexapro and amlodipine -olmesartan shortness breath and elevated heart rate and want to know what to do and want a call back

## 2022-12-24 ENCOUNTER — Encounter: Payer: Self-pay | Admitting: Internal Medicine

## 2022-12-24 ENCOUNTER — Ambulatory Visit: Payer: 59 | Attending: Internal Medicine | Admitting: Internal Medicine

## 2022-12-24 VITALS — BP 142/90 | HR 96 | Ht 71.0 in | Wt 271.6 lb

## 2022-12-24 DIAGNOSIS — E559 Vitamin D deficiency, unspecified: Secondary | ICD-10-CM | POA: Diagnosis not present

## 2022-12-24 DIAGNOSIS — R0602 Shortness of breath: Secondary | ICD-10-CM

## 2022-12-24 DIAGNOSIS — I160 Hypertensive urgency: Secondary | ICD-10-CM

## 2022-12-24 DIAGNOSIS — Z79899 Other long term (current) drug therapy: Secondary | ICD-10-CM

## 2022-12-24 DIAGNOSIS — I10 Essential (primary) hypertension: Secondary | ICD-10-CM

## 2022-12-24 MED ORDER — METOPROLOL SUCCINATE ER 25 MG PO TB24: 25.0000 mg | ORAL_TABLET | Freq: Every day | ORAL | 3 refills | Status: DC

## 2022-12-24 NOTE — Progress Notes (Addendum)
Cardiology Office Note   Date:  12/24/2022   ID:  Mark Gamble, DOB 06/14/1971, MRN [id number]  PCP:  Gamble, Mark G, MD  Cardiologist:   Dietrich Pates, MD   Patient presents for evaluation of HTN, SOB and fast HR.   History of Present Illness: Mark Gamble is a 52 y.o. male with a history of  HTN, dyspnea, hep C (treated) and remote steroid use  Hx of   HTN   The pt was started on Rx  in 2022  The pt was treated with olmesartan and metoprolol.  Did complain of fatigue with metoprolol   Last summer (2023) he had a heat stroke when in [place] (heat index 107)   Stopped meds after that     He is followed in IM   Seen in April by DrJordan   Blood work done   [employer] on [employer] 5/20 mg      [employer] notes when he stopped the metoprolol he has had heart racing, can wake him from sleep     Will hear heart pounding in ears.  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     Working to eat better    Off the road now    Current Meds  Medication Sig   ALPRAZolam (XANAX) 0.25 MG tablet Take 1 tablet (0.25 mg total) by mouth at bedtime as needed for anxiety.   amLODipine-olmesartan (AZOR) 5-20 MG tablet TAKE 1 TABLET BY MOUTH DAILY   busPIRone (BUSPAR) 7.5 MG tablet Take 7.5 mg by mouth 2 (two) times daily.   escitalopram (LEXAPRO) 20 MG tablet Take 1 tablet (20 mg total) by mouth daily.     Allergies:   Patient has no known allergies.   Past Medical History:  Diagnosis Date   Allergy    Anxiety    GERD (gastroesophageal reflux disease)    Hypertension     History reviewed. No pertinent surgical history.   Social History:  The patient   reports that he has never smoked. He has never used smokeless tobacco. He reports that he does not currently use drugs. He reports that he does not drink alcohol.   Family History:  The patient's family history includes Alcohol abuse in his father; Anxiety disorder in his father; Cancer in his father; Depression in his father.    ROS:  Please see the history of present illness. All other systems are reviewed and  Negative to the above problem except as noted.    PHYSICAL EXAM: VS:  BP (!) 142/90   Pulse 96   Ht  (1.803 m)   Wt 271 lb 9.6 oz (123.2 kg)   SpO2 96%   BMI 37.88 kg/m   GEN: Obese 52 yo  in no acute distress  HEENT: normal  Neck: no JVD, no carotid bruit Cardiac: RRR; no murmurs  No Le  edema  Respiratory:  clear to auscultation bilaterally GI: soft, nontender   No hepatomegaly  MS: no deformity Moving all  extremities    EKG:  EKG is ordered today.  NSR 96 bpm     Lipid Panel No results found for: "CHOL", "TRIG", "HDL", "CHOLHDL", "VLDL", "LDLCALC", "LDLDIRECT"    Wt Readings from Last 3 Encounters:  12/24/22 271 lb 9.6 oz (123.2 kg)  12/10/22 266 lb 3.2 oz (120.7 kg)  11/02/21 272 lb 6 oz (123.5 kg)      ASSESSMENT AND PLAN:  1  HTN   The pt has hx of HTN treated for a short time.  Improved on Azor but still high   I would add back Toprol XL at a lower dose   25 mg daily   Follow  Will get echo to evaluate end organ effect need   2  Dyspnea   The pt has DOE   Erratic  ? Related to HTN   I would recomm an echo to evaluate systolic and diastolic function.    Concern with hx of steroid and HTN for CAD  Review Echo   Consider ischemic testing   3  Hx Hep C   Treated several years ago, patient reports full response   CT shows evid of cirrhosis   he does not have a GI doctor in [place]  4  MEtabolics    Will check HgbA1C  Check Lipomed, Lpa, ApoB, BMET and Hgb A1C     Follow up based on test results   Current medicines are reviewed at length with the  patient today.  The patient does not have concerns regarding medicines.  Signed, Dietrich Pates, MD  12/24/2022 3:31 PM    [facility] [address] [place], [place], [place]  [address] Phone: [phone]; Fax: [phone]

## 2022-12-24 NOTE — Patient Instructions (Signed)
Medication Instructions:  TOPROL XL 25 MG DAILY   *If you need a refill on your cardiac medications before your next appointment, please call your pharmacy*   Lab Work: NMR, APO B, LIPO A, HGBA1C, BMET If you have labs (blood work) drawn today and your tests are completely normal, you will receive your results only by: MyChart Message (if you have MyChart) OR A paper copy in the mail If you have any lab test that is abnormal or we need to change your treatment, we will call you to review the results.   Testing/Procedures:  Your physician has requested that you have an echocardiogram. Echocardiography is a painless test that uses sound waves to create images of your heart. It provides your doctor with information about the size and shape of your heart and how well your heart's chambers and valves are working. This procedure takes approximately one hour. There are no restrictions for this procedure. Please do NOT wear cologne, perfume, aftershave, or lotions (deodorant is allowed). Please arrive 15 minutes prior to your appointment time.     Follow-Up: At [facility], you and your health needs are our priority.  As part of our continuing mission to provide you with exceptional heart care, we have created designated Provider Care Teams.  These Care Teams include your primary Cardiologist (physician) and Advanced Practice Providers (APPs -  Physician Assistants and Nurse Practitioners) who all work together to provide you with the care you need, when you need it.  We recommend signing up for the patient portal called "MyChart".  Sign up information is provided on this After Visit Summary.  MyChart is used to connect with patients for Virtual Visits (Telemedicine).  Patients are able to view lab/test results, encounter notes, upcoming appointments, etc.  Non-urgent messages can be sent to your provider as well.   To learn more about what you can do with MyChart, go to  [email or web address].     WILL PLAN FOLLOW UP LATER AFTER TESTING

## 2022-12-25 LAB — NMR, LIPOPROFILE

## 2022-12-25 LAB — HEMOGLOBIN A1C: Est. average glucose Bld gHb Est-mCnc: 120 mg/dL

## 2022-12-26 LAB — NMR, LIPOPROFILE
Cholesterol, Total: 288 mg/dL — ABNORMAL HIGH (ref 100–199)
LDL Particle Number: 2322 nmol/L — ABNORMAL HIGH (ref <1000)
Small LDL Particle Number: 1697 nmol/L — ABNORMAL HIGH (ref <=527)

## 2022-12-26 LAB — LIPOPROTEIN A (LPA)

## 2022-12-26 LAB — APOLIPOPROTEIN B: Apolipoprotein B: 154 mg/dL — ABNORMAL HIGH (ref <90)

## 2022-12-26 LAB — BASIC METABOLIC PANEL: Creatinine, Ser: 0.89 mg/dL (ref 0.76–1.27)

## 2022-12-27 LAB — NMR, LIPOPROFILE
HDL Particle Number: 39 umol/L (ref >=30.5)
LDL Size: 19.8 nm — ABNORMAL LOW (ref >20.5)
LP-IR Score: 84 — ABNORMAL HIGH (ref <=45)
Triglycerides: 527 mg/dL — ABNORMAL HIGH (ref 0–149)

## 2022-12-27 LAB — BASIC METABOLIC PANEL
BUN/Creatinine Ratio: 25 — ABNORMAL HIGH (ref 9–20)
BUN: 22 mg/dL (ref 6–24)
CO2: 23 mmol/L (ref 20–29)
Calcium: 9.8 mg/dL (ref 8.7–10.2)
Chloride: 103 mmol/L (ref 96–106)
Glucose: 89 mg/dL (ref 70–99)
Potassium: 4.3 mmol/L (ref 3.5–5.2)
Sodium: 140 mmol/L (ref 134–144)
eGFR: 102 mL/min/{1.73_m2} (ref >59)

## 2022-12-27 LAB — HEMOGLOBIN A1C: Hgb A1c MFr Bld: 5.8 % — ABNORMAL HIGH (ref 4.8–5.6)

## 2022-12-28 ENCOUNTER — Telehealth: Payer: Self-pay | Admitting: Internal Medicine

## 2022-12-28 ENCOUNTER — Encounter: Payer: Self-pay | Admitting: Internal Medicine

## 2022-12-28 DIAGNOSIS — R0683 Snoring: Secondary | ICD-10-CM

## 2022-12-28 DIAGNOSIS — R0602 Shortness of breath: Secondary | ICD-10-CM

## 2022-12-28 DIAGNOSIS — I10 Essential (primary) hypertension: Secondary | ICD-10-CM

## 2022-12-28 NOTE — Telephone Encounter (Signed)
Pt's spouse is requesting a callback to see how she can get the results from the sleep study oxygen monitor to Dr. Tenny Craw digitally as requested at the last office visited she stated. Please advise

## 2022-12-28 NOTE — Telephone Encounter (Signed)
Returned call to patient's wife Jolene. She states they have a digital report of results of home sleep study oxygen monitor and would like to know how to get these results to Dr. Tenny Craw.  Attempted to walk her through attaching to MyChart message and she states she is unable to attach the file.  Jolene states she is at work and can fax the results to our office. Provided fax number [phone]. Jolene expressed appreciation and will be faxing those results over to our office today.

## 2022-12-29 NOTE — Telephone Encounter (Signed)
Patient needs a full sleep study   [address]

## 2022-12-29 NOTE — Progress Notes (Unsigned)
HPI:  [name] is a 52 y.o. male, who is here today to follow on recent tests that have been done.   Review of Systems See other pertinent positives and negatives in HPI.  Current Outpatient Medications on File Prior to Visit  Medication Sig Dispense Refill   ALPRAZolam (XANAX) 0.5 MG tablet Take 0.5 mg by mouth as needed for anxiety.     busPIRone (BUSPAR) 7.5 MG tablet Take 7.5 mg by mouth 2 (two) times daily.     metoprolol succinate (TOPROL XL) 25 MG 24 hr tablet Take 1 tablet (25 mg total) by mouth daily. 90 tablet 3   No current facility-administered medications on file prior to visit.    Past Medical History:  Diagnosis Date   Allergy    Anxiety    GERD (gastroesophageal reflux disease)    Hypertension    No Known Allergies  Social History   Socioeconomic History   Marital status: Married    Spouse name: Not on file   Number of children: Not on file   Years of education: Not on file   Highest education level: Not on file  Occupational History   Not on file  Tobacco Use   Smoking status: Never   Smokeless tobacco: Never  Substance and Sexual Activity   Alcohol use: No   Drug use: Not Currently    Comment: Marijuana at age 89.   Sexual activity: Yes  Other Topics Concern   Not on file  Social History Narrative   Not on file   Social Determinants of Health   Financial Resource Strain: Not on file  Food Insecurity: Not on file  Transportation Needs: Not on file  Physical Activity: Not on file  Stress: Not on file  Social Connections: Not on file    Vitals:   12/31/22 1544  BP: 136/80  Pulse: 88  Resp: 16  SpO2: 97%   Body mass index is 37.55 kg/m.  Physical Exam Vitals and nursing note reviewed.  Constitutional:      General: He is not in acute distress.    Appearance: He is well-developed.  HENT:     Head: Normocephalic and atraumatic.     Mouth/Throat:     Mouth: Mucous membranes are moist.  Eyes:     Conjunctiva/sclera:  Conjunctivae normal.  Cardiovascular:     Rate and Rhythm: Normal rate and regular rhythm.     Pulses:          Posterior tibial pulses are 2+ on the right side and 2+ on the left side.     Heart sounds: No murmur heard.    Comments: Trace pitting LE edema, bilateral. Pulmonary:     Effort: Pulmonary effort is normal. No respiratory distress.     Breath sounds: Normal breath sounds.  Abdominal:     Palpations: Abdomen is soft. There is no hepatomegaly or mass.     Tenderness: There is no abdominal tenderness.  Lymphadenopathy:     Cervical: No cervical adenopathy.  Skin:    General: Skin is warm.     Findings: No erythema or rash.  Neurological:     General: No focal deficit present.     Mental Status: He is alert and oriented to person, place, and time.     Cranial Nerves: No cranial nerve deficit.     Gait: Gait normal.  Psychiatric:        Mood and Affect: Mood is anxious.     Comments:  Well groomed, good eye contact.     ASSESSMENT AND PLAN:  Mark Gamble was seen today for follow-up.  Diagnoses and all orders for this visit:  Essential (primary) hypertension -     amLODipine-olmesartan (AZOR) 5-20 MG tablet; Take 1 tablet by mouth daily.  Depression, major, recurrent, moderate (HCC) -     escitalopram (LEXAPRO) 20 MG tablet; Take 1 tablet (20 mg total) by mouth daily.    No orders of the defined types were placed in this encounter.   No problem-specific Assessment & Plan notes found for this encounter.   No follow-ups on file.  Harriette Tovey G. [place], MD  [facility]. [place] office.

## 2022-12-30 NOTE — Telephone Encounter (Addendum)
I called and spoke with the pt.. and I spoke with Okey Regal... the pt is extremely anxious to get all of this going since he has had extremely high BP and he cannot renew his CBL and he is out of work. I have placed the order and he will come in tomorrow at 3pm for his Itamar... he lives out of town and will be in town tomorrow for a PCP appt at [facility]. He downloaded the APP and watched the video  and his Stop Brennan Bailey is 8.   Will send staff message to Coralee North to hopefully expedite his precert.      Patient Name:         DOB:       Height:     Weight:  Office Name:         Referring Provider:  Today's Date:  Date:   STOP BANG RISK ASSESSMENT S (snore) Have you been told that you snore?     YES   T (tired) Are you often tired, fatigued, or sleepy during the day?   YES  O (obstruction) Do you stop breathing, choke, or gasp during sleep? YES   P (pressure) Do you have or are you being treated for high blood pressure? YES   B (BMI) Is your body index greater than 35 kg/m? YES   A (age) Are you 52 years old or older? YES   N (neck) Do you have a neck circumference greater than 16 inches?   YES   G (gender) Are you a male? YES   TOTAL STOP/BANG "YES" ANSWERS                                                                        For Office Use Only              Procedure Order Form    YES to 3+ Stop Bang questions OR two clinical symptoms - patient qualifies for WatchPAT (CPT 95800)             Clinical Notes: Will consult Sleep Specialist and refer for management of therapy due to patient increased risk of Sleep Apnea. Ordering a sleep study due to the following two clinical symptoms: Excessive daytime sleepiness G47.10 / Gastroesophageal reflux K21.9 / Nocturia R35.1 / Morning Headaches G44.221 / Difficulty concentrating R41.840 / Memory problems or poor judgment G31.84 / Personality changes or irritability R45.4 / Loud snoring R06.83 / Depression F32.9 / Unrefreshed by sleep G47.8 /  Impotence N52.9 / History of high blood pressure R03.0 / Insomnia G47.00    I understand that I am proceeding with a home sleep apnea test as ordered by my treating physician. I understand that untreated sleep apnea is a serious cardiovascular risk factor and it is my responsibility to perform the test and seek management for sleep apnea. I will be contacted with the results and be managed for sleep apnea by a local sleep physician. I will be receiving equipment and further instructions from [facility]. I shall promptly ship back the equipment via the included mailing label. I understand my insurance will be billed for the test and as the patient I am responsible for any insurance related out-of-pocket costs incurred. I have  been provided with written instructions and can call for additional video or telephonic instruction, with 24-hour availability of qualified personnel to answer any questions: Patient Help Desk [phone].  Patient Signature ______________________________________________________   Date______________________ Patient Telemedicine Verbal Consent

## 2022-12-31 ENCOUNTER — Telehealth: Payer: Self-pay | Admitting: *Deleted

## 2022-12-31 ENCOUNTER — Ambulatory Visit: Payer: 59 | Admitting: Family Medicine

## 2022-12-31 ENCOUNTER — Encounter: Payer: Self-pay | Admitting: Family Medicine

## 2022-12-31 VITALS — BP 136/80 | HR 88 | Resp 16 | Ht 71.0 in | Wt 269.2 lb

## 2022-12-31 DIAGNOSIS — R519 Headache, unspecified: Secondary | ICD-10-CM

## 2022-12-31 DIAGNOSIS — R7989 Other specified abnormal findings of blood chemistry: Secondary | ICD-10-CM | POA: Diagnosis not present

## 2022-12-31 DIAGNOSIS — I10 Essential (primary) hypertension: Secondary | ICD-10-CM | POA: Diagnosis not present

## 2022-12-31 DIAGNOSIS — F331 Major depressive disorder, recurrent, moderate: Secondary | ICD-10-CM

## 2022-12-31 DIAGNOSIS — I160 Hypertensive urgency: Secondary | ICD-10-CM

## 2022-12-31 DIAGNOSIS — F411 Generalized anxiety disorder: Secondary | ICD-10-CM

## 2022-12-31 MED ORDER — TRAMADOL HCL 50 MG PO TABS
50.0000 mg | ORAL_TABLET | Freq: Every day | ORAL | 0 refills | Status: DC | PRN
Start: 2022-12-31 — End: 2023-03-04

## 2022-12-31 MED ORDER — AMLODIPINE-OLMESARTAN 5-20 MG PO TABS
1.0000 | ORAL_TABLET | Freq: Every day | ORAL | 2 refills | Status: DC
Start: 2022-12-31 — End: 2023-01-26

## 2022-12-31 MED ORDER — ESCITALOPRAM OXALATE 20 MG PO TABS: 20.0000 mg | ORAL_TABLET | Freq: Every day | ORAL | 2 refills | Status: DC

## 2022-12-31 NOTE — Telephone Encounter (Signed)
Pt came to pick the Kirwin Device.  Insurance already authorized and pin # given.

## 2022-12-31 NOTE — Patient Instructions (Signed)
A few things to remember from today's visit:  Abnormal LFTs  Essential (primary) hypertension - Plan: amLODipine-olmesartan (AZOR) 5-20 MG tablet  Depression, major, recurrent, moderate (HCC) - Plan: escitalopram (LEXAPRO) 20 MG tablet  If you need refills for medications you take chronically, please call your pharmacy. Do not use My Chart to request refills or for acute issues that need immediate attention. If you send a my chart message, it may take a few days to be addressed, specially if I am not in the office.  Please be sure medication list is accurate. If a new problem present, please set up appointment sooner than planned today.

## 2022-12-31 NOTE — Telephone Encounter (Signed)
Prior Authorization for ITAMAR sent to [employer] via web portal. Tracking Number . READY- NO PA REQ 

## 2023-01-01 ENCOUNTER — Encounter (INDEPENDENT_AMBULATORY_CARE_PROVIDER_SITE_OTHER): Payer: 59 | Admitting: Cardiology

## 2023-01-01 DIAGNOSIS — G4733 Obstructive sleep apnea (adult) (pediatric): Secondary | ICD-10-CM | POA: Diagnosis not present

## 2023-01-01 NOTE — Assessment & Plan Note (Signed)
Problem is chronic, he has reported history of cirrhosis, hx of hep C. Liver US was ordered in 06/2021, he doe snot want further work up at this time, prefers to hold until completing CV studies. Avoid alcohol intake. Acetaminophen up to 2 g if needed for pain.

## 2023-01-01 NOTE — Assessment & Plan Note (Signed)
Some of his co-morbilities can be contributing factors. I do not think we can hold on imaging until his OSA is appropriately treated and BP better control unless problem gets worse or he has a new associated symptom. Because HTN and hx of liver disease, recommend avoiding NSAID's. Tramadol 50 mg 1/2-1 tab daily as needed, side effects discussed. He was clearly instructed about warning signs.

## 2023-01-01 NOTE — Assessment & Plan Note (Signed)
On Buspar and Xanax . Following with psychiatrist.

## 2023-01-01 NOTE — Assessment & Plan Note (Signed)
On Lexapro 20 mg daily. He has established with psychiatrist.

## 2023-01-01 NOTE — Assessment & Plan Note (Signed)
BP has improved but still above goal. Re-checked today 148/100. He just started Metoprolol succinate and does not want to increase dose for now. Continue Amlodipine-Olmesartan 5-20 mg daily and Metoprolol succinate 25 mg daily as well as low salt diet. Continue monitoring BP.

## 2023-01-02 ENCOUNTER — Ambulatory Visit: Payer: 59 | Attending: Internal Medicine

## 2023-01-02 DIAGNOSIS — I10 Essential (primary) hypertension: Secondary | ICD-10-CM

## 2023-01-02 DIAGNOSIS — R0602 Shortness of breath: Secondary | ICD-10-CM

## 2023-01-02 DIAGNOSIS — R0683 Snoring: Secondary | ICD-10-CM

## 2023-01-02 NOTE — Procedures (Signed)
Patient Information Study Date: 01/01/2023 Patient Name: Mark Gamble Patient ID: [id number] Birth Date: [birth year] Age: 52 Gender: Male BMI: 38.0 (W=271 lb, H=5' 11'') Stopbang: 8 Referring Physician: Dietrich Pates, MD  TEST DESCRIPTION: Home sleep apnea testing was completed using the WatchPat, a Type 1 device, utilizing peripheral arterial tonometry (PAT), chest movement, actigraphy, pulse oximetry, pulse rate, body position and snore.  AHI was calculated with apnea and hypopnea using valid sleep time as the denominator. RDI includes apneas, hypopneas, and RERAs.  The data acquired and the scoring of sleep and all associated events were performed in accordance with the recommended standards and specifications as outlined in the AASM Manual for the Scoring of Sleep and Associated Events 2.2.0 (2015).  FINDINGS:  1.  Severe Obstructive Sleep Apnea with AHI 59/hr.   2.  No significant Central Sleep Apnea with pAHIc 9.5/hr.  3.  Oxygen desaturations as low as 72%.  4.  Mild to moderate snoring was present. O2 sats were < 88% for 25.2 min.  5.  Total sleep time was 4 hrs and 30 min.  6.  26.3% of total sleep time was spent in REM sleep.   7.   sleep onset latency at 6 min.   8.   REM sleep onset latency at 208 min.   9.  Total awakenings were 5.  10. Arrhythmia detection:  None.  DIAGNOSIS:   Severe Obstructive Sleep Apnea (G47.33) Nocturnal Hypoxemia  RECOMMENDATIONS:   1.  Clinical correlation of these findings is necessary.  The decision to treat obstructive sleep apnea (OSA) is usually based on the presence of apnea symptoms or the presence of associated medical conditions such as Hypertension, Congestive Heart Failure, Atrial Fibrillation or Obesity.  The most common symptoms of OSA are snoring, gasping for breath while sleeping, daytime sleepiness and fatigue.   2.  Initiating apnea therapy is recommended given the presence of symptoms and/or associated conditions. Recommend  proceeding with one of the following:     a.  Auto-CPAP therapy with a pressure range of 5-20cm H2O.     b.  An oral appliance (OA) that can be obtained from certain dentists with expertise in sleep medicine.  These are primarily of use in non-obese patients with mild and moderate disease.     c.  An ENT consultation which may be useful to look for specific causes of obstruction and possible treatment options.     d.  If patient is intolerant to PAP therapy, consider referral to ENT for evaluation for hypoglossal nerve stimulator.   3.  Close follow-up is necessary to ensure success with CPAP or oral appliance therapy for maximum benefit.  4.  A follow-up oximetry study on CPAP is recommended to assess the adequacy of therapy and determine the need for supplemental oxygen or the potential need for Bi-level therapy.  An arterial blood gas to determine the adequacy of baseline ventilation and oxygenation should also be considered.  5.  Healthy sleep recommendations include:  adequate nightly sleep (normal 7-9 hrs/night), avoidance of caffeine after noon and alcohol near bedtime, and maintaining a sleep environment that is cool, dark and quiet.  6.  Weight loss for overweight patients is recommended.  Even modest amounts of weight loss can significantly improve the severity of sleep apnea.  7.  Snoring recommendations include:  weight loss where appropriate, side sleeping, and avoidance of alcohol before bed.  8.  Operation of motor vehicle should be avoided when sleepy.  Signature: Shaynah Hund  Mayford Knife, MD; [facility]; Diplomat, American Board of Sleep Medicine Electronically Signed: 01/02/2023 8:26:17 PM

## 2023-01-03 ENCOUNTER — Telehealth: Payer: Self-pay | Admitting: Internal Medicine

## 2023-01-03 ENCOUNTER — Telehealth: Payer: Self-pay | Admitting: *Deleted

## 2023-01-03 DIAGNOSIS — R0683 Snoring: Secondary | ICD-10-CM

## 2023-01-03 DIAGNOSIS — I10 Essential (primary) hypertension: Secondary | ICD-10-CM

## 2023-01-03 DIAGNOSIS — G4733 Obstructive sleep apnea (adult) (pediatric): Secondary | ICD-10-CM

## 2023-01-03 NOTE — Telephone Encounter (Signed)
Pt's spouse called in regards to sleep apnea test results seen on MyChart and would like a call a call back in regards to what to do next. Please advise

## 2023-01-03 NOTE — Telephone Encounter (Signed)
-----   Message from Gaynelle Cage, [place] sent at 01/03/2023  7:58 AM EDT -----  ----- Message ----- From: Quintella Reichert, MD Sent: 01/02/2023   8:27 PM EDT To: Cv Div Sleep Studies  Please let patient know that they have sleep apnea.  Recommend therapeutic CPAP titration for treatment of patient's sleep disordered breathing.  If unable to perform an in lab titration then initiate ResMed auto CPAP from 4 to 15cm H2O with heated humidity and mask of choice and overnight pulse ox on CPAP.

## 2023-01-03 NOTE — Telephone Encounter (Signed)
Reached out to patient but no voicemail set up to leave a message.

## 2023-01-03 NOTE — Addendum Note (Signed)
Addended by: Anselm Lis A on: 01/03/2023 11:05 AM   Modules accepted: Orders

## 2023-01-04 NOTE — Addendum Note (Signed)
Addended by: Reesa Chew on: 01/04/2023 02:55 PM   Modules accepted: Orders

## 2023-01-04 NOTE — Telephone Encounter (Addendum)
Return Call:  The patient has been notified of the result and verbalized understanding.  All questions (if any) were answered. Mark Gamble, CMA 01/04/2023 2:46 PM    If unable to perform an in lab titration then initiate ResMed auto CPAP from 4 to 15cm H2O with heated humidity and mask of choice and overnight pulse ox on CPAP.      Upon patient request DME selection is [facility] Patient understands he will be contacted by [facility] to set up his cpap. Patient understands to call if [facility] does not contact him with new setup in a timely manner. Patient understands they will be called once confirmation has been received from [place] that they have received their new machine to schedule 10 week follow up appointment.   [facility] notified of new cpap order  Please add to airview Patient was grateful for the call and thanked me

## 2023-01-17 ENCOUNTER — Encounter: Payer: Self-pay | Admitting: Internal Medicine

## 2023-01-19 ENCOUNTER — Telehealth: Payer: Self-pay | Admitting: Internal Medicine

## 2023-01-19 NOTE — Telephone Encounter (Signed)
See My Chart encounter RE; this message. Will close this encounter.

## 2023-01-19 NOTE — Telephone Encounter (Signed)
Pt c/o medication issue:  1. Name of Medication:   metoprolol succinate (TOPROL XL) 25 MG 24 hr tablet    2. How are you currently taking this medication (dosage and times per day)?    3. Are you having a reaction (difficulty breathing--STAT)? no  4. What is your medication issue? Wife I calling to see if the dosage can be increase for elevated HR. Please advise

## 2023-01-19 NOTE — Telephone Encounter (Signed)
Pts wife called to report that for his CBL his BP and HR have to be lower and they are noticing that with minimal exercise his HR is in the 130's and not the 90's as it used to be... he was on Toprol 50 mg but was taken down to 25 mg due to fatigue but he is asking that he go back up to 50 mg.   I will forward to Dr Tenny Craw.

## 2023-01-20 ENCOUNTER — Telehealth: Payer: Self-pay | Admitting: Internal Medicine

## 2023-01-20 NOTE — Telephone Encounter (Signed)
Pt's spouse called to f/u on getting a callback from discussion she had with nurse yesterday and was checking to see if she'd spoke with MD. She also sent a message on MyChart and would still like a callback. Please advise

## 2023-01-20 NOTE — Telephone Encounter (Signed)
Returned pt's call. Told pt's wife I forwarded the MyChart message that was sent along with this call to Dr. Tenny Craw and her RN, whom are currently out of the office, and that someone would reach back out to her once we had an answer. See MyChart messages.

## 2023-01-21 ENCOUNTER — Telehealth: Payer: Self-pay | Admitting: Internal Medicine

## 2023-01-21 DIAGNOSIS — I10 Essential (primary) hypertension: Secondary | ICD-10-CM

## 2023-01-21 DIAGNOSIS — R609 Edema, unspecified: Secondary | ICD-10-CM

## 2023-01-21 MED ORDER — METOPROLOL SUCCINATE ER 50 MG PO TB24: 50.0000 mg | ORAL_TABLET | Freq: Every day | ORAL | 3 refills | Status: DC

## 2023-01-21 NOTE — Telephone Encounter (Signed)
Pt c/o swelling: STAT is pt has developed SOB within 24 hours  If swelling, where is the swelling located? Swelling in both legs  How much weight have you gained and in what time span? N/A  Have you gained 3 pounds in a day or 5 pounds in a week? N/A  Do you have a log of your daily weights (if so, list)? No   Are you currently taking a fluid pill? No   Are you currently SOB? No   Have you traveled recently? No

## 2023-01-21 NOTE — Telephone Encounter (Signed)
Spoke with pt who complains of bilateral pitting lower extremity edema x 2 days.  Denies current CP, SOB or dizziness.  Pt reports BP yesterday 139/93 with HR in the 80's.  Pt has not taken his BP today.  He reports he is following a low sodium diet and taking medications as prescribed.  Pt denies's redness in either leg.  He reports his HR has been in the 80's and with exercise easily goes to 130+.   Pt encouraged to continue medications, following a low sodium diet and monitoring blood pressure 2 hours after taking medications.  He should be elevating feet and kegs while sitting.  May try compression stockings to help with edema.  He is scheduled for an echo on 01/24/2023.  Will forward to Dr Tenny Craw for further review and recommendations.  Pt verbalizes understanding and agrees with current plan.

## 2023-01-21 NOTE — Telephone Encounter (Signed)
Increase to 50mg

## 2023-01-23 NOTE — Telephone Encounter (Signed)
Pt is due to come in for echocardiogram   ? 01/24/23?    Have him come in for BMET and BNP that day or if not scheduled have him come in anyway. Need readings of BP

## 2023-01-24 ENCOUNTER — Ambulatory Visit (HOSPITAL_COMMUNITY): Payer: 59 | Attending: Internal Medicine

## 2023-01-24 ENCOUNTER — Other Ambulatory Visit: Payer: Self-pay

## 2023-01-24 ENCOUNTER — Telehealth: Payer: Self-pay | Admitting: Internal Medicine

## 2023-01-24 ENCOUNTER — Ambulatory Visit: Payer: 59

## 2023-01-24 DIAGNOSIS — R609 Edema, unspecified: Secondary | ICD-10-CM

## 2023-01-24 DIAGNOSIS — I10 Essential (primary) hypertension: Secondary | ICD-10-CM

## 2023-01-24 DIAGNOSIS — Z79899 Other long term (current) drug therapy: Secondary | ICD-10-CM | POA: Diagnosis present

## 2023-01-24 DIAGNOSIS — R0602 Shortness of breath: Secondary | ICD-10-CM | POA: Diagnosis present

## 2023-01-24 DIAGNOSIS — E559 Vitamin D deficiency, unspecified: Secondary | ICD-10-CM

## 2023-01-24 DIAGNOSIS — I160 Hypertensive urgency: Secondary | ICD-10-CM

## 2023-01-24 LAB — ECHOCARDIOGRAM COMPLETE
AR max vel: 2.51 cm2
AV Area VTI: 2.44 cm2
AV Area mean vel: 2.46 cm2
AV Mean grad: 6.3 mmHg
AV Peak grad: 12.6 mmHg
Ao pk vel: 1.77 m/s
Area-P 1/2: 3.06 cm2
S' Lateral: 3.2 cm

## 2023-01-24 NOTE — Telephone Encounter (Signed)
Patient's wife is returning call. 

## 2023-01-24 NOTE — Progress Notes (Signed)
BNP and BMET ordered per Dr Tenny Craw.

## 2023-01-24 NOTE — Telephone Encounter (Signed)
Patient came to check out today after his echo and stated he would like the soonest FU with Dr Tenny Craw he could get. Patient stated his BP and BPM were too high and he wanted to discuss this. He stated that on Friday his metroprolol was increased and wanted a call back. He had his echo/labs today 01/24/23. Please advise.

## 2023-01-24 NOTE — Telephone Encounter (Signed)
Attempt to call the patient, unable to leave VM message.

## 2023-01-24 NOTE — Telephone Encounter (Signed)
Spoke with pt and advised per Dr Tenny Craw he should have labs while he is in office for echo.  Lab orders placed.  Pt verbalizes understanding and thanked Charity fundraiser for the call.

## 2023-01-25 ENCOUNTER — Telehealth: Payer: Self-pay | Admitting: Internal Medicine

## 2023-01-25 LAB — BASIC METABOLIC PANEL
BUN/Creatinine Ratio: 19 (ref 9–20)
BUN: 19 mg/dL (ref 6–24)
CO2: 19 mmol/L — ABNORMAL LOW (ref 20–29)
Calcium: 9.5 mg/dL (ref 8.7–10.2)
Chloride: 102 mmol/L (ref 96–106)
Creatinine, Ser: 1.01 mg/dL (ref 0.76–1.27)
Glucose: 102 mg/dL — ABNORMAL HIGH (ref 70–99)
Potassium: 4.2 mmol/L (ref 3.5–5.2)
Sodium: 139 mmol/L (ref 134–144)
eGFR: 89 mL/min/{1.73_m2} (ref >59)

## 2023-01-25 LAB — PRO B NATRIURETIC PEPTIDE: NT-Pro BNP: <36 pg/mL (ref 0–121)

## 2023-01-25 NOTE — Telephone Encounter (Signed)
Patient reports that he has his own pulse ox that records readings overnight. He is able to save data and send it in to [place]. He states that he will do it tonight and send [place] results through MyChart tomorrow.

## 2023-01-25 NOTE — Telephone Encounter (Signed)
Patient wife's Corrie Dandy called wanting to speak to Dr. Tenny Craw' nurse about the test that was order to measure his oxygen for his CPAP.

## 2023-01-26 ENCOUNTER — Encounter: Payer: Self-pay | Admitting: Internal Medicine

## 2023-01-26 ENCOUNTER — Telehealth: Payer: Self-pay

## 2023-01-26 DIAGNOSIS — R609 Edema, unspecified: Secondary | ICD-10-CM

## 2023-01-26 DIAGNOSIS — R0602 Shortness of breath: Secondary | ICD-10-CM

## 2023-01-26 DIAGNOSIS — I10 Essential (primary) hypertension: Secondary | ICD-10-CM

## 2023-01-26 MED ORDER — OLMESARTAN MEDOXOMIL 20 MG PO TABS: 20.0000 mg | ORAL_TABLET | Freq: Every day | ORAL | 3 refills | Status: DC

## 2023-01-26 MED ORDER — AMLODIPINE BESYLATE 2.5 MG PO TABS: 2.5000 mg | ORAL_TABLET | Freq: Every day | ORAL | 3 refills | Status: DC

## 2023-01-26 NOTE — Telephone Encounter (Signed)
-----   Message from Dietrich Pates V, MD sent at 01/26/2023  7:07 AM EDT ----- Electrolytes and kidney function are normal Fluid appears normal overall I would recomm:  Split Azor to olmesartan plus amlodipine Cut amlodipine to 2.5mg    Keep on olmesartan With dyspnea, set up  for PET/CT to evaluate for ischemia Pt will need follow up in clinic to reassess

## 2023-01-26 NOTE — Telephone Encounter (Signed)
See other encounter for med change and Cardiac Pet ordered per DR Tenny Craw.

## 2023-01-26 NOTE — Telephone Encounter (Signed)
See result encounter.. pt to have Cardiac Pet CT Scan.

## 2023-01-26 NOTE — Telephone Encounter (Signed)
The patient has been notified of the result and verbalized understanding.  All questions (if any) were answered. Bertram Millard, RN 01/26/2023 9:29 AM

## 2023-01-27 NOTE — Telephone Encounter (Signed)
The patient has been notified of the result and verbalized understanding.  All questions (if any) were answered.     

## 2023-02-04 ENCOUNTER — Ambulatory Visit: Payer: 59 | Admitting: Internal Medicine

## 2023-02-07 ENCOUNTER — Encounter: Payer: Self-pay | Admitting: Cardiology

## 2023-03-01 ENCOUNTER — Other Ambulatory Visit: Payer: Self-pay | Admitting: Family Medicine

## 2023-03-01 DIAGNOSIS — R519 Headache, unspecified: Secondary | ICD-10-CM

## 2023-03-01 NOTE — Telephone Encounter (Signed)
Last OV 12/31/22

## 2023-03-01 NOTE — Telephone Encounter (Signed)
Prescription Request  03/01/2023  LOV: 12/31/2022  Spouse called to request refills for Pt.   What is the name of the medication or equipment?   traMADol (ULTRAM) 50 MG tablet   And Trazadone  Have you contacted your pharmacy to request a refill? No   Which pharmacy would you like this sent to?   [facility] [phone] - [place], [place] - [address] AT [facility] [address] [place] [address] Phone: [phone] Fax: [phone]  Patient notified that their request is being sent to the clinical staff for review and that they should receive a response within 2 business days.   Please advise at Mobile [phone] (mobile)

## 2023-03-03 ENCOUNTER — Other Ambulatory Visit: Payer: Self-pay | Admitting: Family Medicine

## 2023-03-03 DIAGNOSIS — F322 Major depressive disorder, single episode, severe without psychotic features: Secondary | ICD-10-CM

## 2023-03-03 DIAGNOSIS — F411 Generalized anxiety disorder: Secondary | ICD-10-CM

## 2023-03-03 DIAGNOSIS — G4700 Insomnia, unspecified: Secondary | ICD-10-CM

## 2023-03-03 DIAGNOSIS — R519 Headache, unspecified: Secondary | ICD-10-CM

## 2023-03-04 ENCOUNTER — Encounter: Payer: Self-pay | Admitting: Family Medicine

## 2023-03-04 ENCOUNTER — Telehealth (INDEPENDENT_AMBULATORY_CARE_PROVIDER_SITE_OTHER): Payer: 59 | Admitting: Family Medicine

## 2023-03-04 VITALS — Ht 71.0 in

## 2023-03-04 DIAGNOSIS — F99 Mental disorder, not otherwise specified: Secondary | ICD-10-CM

## 2023-03-04 DIAGNOSIS — R519 Headache, unspecified: Secondary | ICD-10-CM | POA: Diagnosis not present

## 2023-03-04 DIAGNOSIS — F5105 Insomnia due to other mental disorder: Secondary | ICD-10-CM

## 2023-03-04 DIAGNOSIS — I160 Hypertensive urgency: Secondary | ICD-10-CM

## 2023-03-04 MED ORDER — TRAZODONE HCL 50 MG PO TABS
50.0000 mg | ORAL_TABLET | Freq: Every day | ORAL | 1 refills | Status: DC
Start: 2023-03-04 — End: 2023-03-08

## 2023-03-04 NOTE — Assessment & Plan Note (Signed)
It seems to happen if he does not sleep well, states that when he takes trazodone he gets a better sleep and he does not have headache the next day. So for now we will hold on head imaging. He will continue trazodone 50 mg daily at bedtime. Instructed about warning signs.

## 2023-03-04 NOTE — Assessment & Plan Note (Signed)
BP has improved, still having a mildly elevated DSP. Continue amlodipine 2.5 mg daily, metoprolol succinate 50 mg daily, and olmesartan 20 mg daily. Continue monitoring BP regularly and following low-salt diet. Follows with cardiology regularly, Dr Tenny Craw.

## 2023-03-04 NOTE — Progress Notes (Signed)
Virtual Visit via Video Note I connected with Mark Gamble on 03/04/23 by a video enabled telemedicine application and verified that I am speaking with the correct person using two identifiers. Location patient: home Location provider:work office Persons participating in the virtual visit: patient, provider  I discussed the limitations of evaluation and management by telemedicine and the availability of in person appointments. The patient expressed understanding and agreed to proceed.  Chief Complaint  Patient presents with   Medication Management   HPI: Mark Gamble is a 52 yo male with PMHx significant for OSA,HTN,and anxiety c/o persistent frontal and bitemporal headaches and insomnia. He is requesting refill for Tramadol 50 mg, which was prescribed in 12/2022 to treat headache attributed to untreated OSA.  He started wearing a CPAP machine for sleep apnea about 1.5 month ago but still having headache.  The headaches as dull, and not daily with a severity ranging from 3 to 5 out of 10. They occur more frequently when he does not sleep well, typically in the late afternoon hours. He denies any associated symptoms such as visual changes, nausea, vomiting, or photophobia. Negative for fever or focal neurologic deficit.  Insomnia: He resumed Trazodone 50 mg a months ago and it is helping with sleep, requesting refills.  He is currently on Buspar 5 mg tid and Lexapro 20 mg daily for the management of depression and anxiety, medications have helped. He saw his psychiatrist 3 weeks ago, not sure how often he is supposed to follow.  He has not noted any side effects while taking Trazodone.  He mentions that his blood pressure has improved since starting wearing his CPAP machine. He is currently on amlodipine 2.5 mg daily, Benicar 20 mg , and metoprolol succinate 50 mg daily. Follows with cardiologist and pending a cardiac perfusion imaging. Echo in 01/2022 LVEF 60-65% and grade I diastolic  dysfunction. Negative for chest pain, dyspnea, palpitation, orthopnea,PND, or edema.  ROS: See pertinent positives and negatives per HPI.  Past Medical History:  Diagnosis Date   Allergy    Anxiety    GERD (gastroesophageal reflux disease)    Hypertension    History reviewed. No pertinent surgical history.  Family History  Problem Relation Age of Onset   Depression Father    Anxiety disorder Father    Cancer Father        lung cancer   Alcohol abuse Father    Social History   Socioeconomic History   Marital status: Married    Spouse name: Not on file   Number of children: Not on file   Years of education: Not on file   Highest education level: Not on file  Occupational History   Not on file  Tobacco Use   Smoking status: Never   Smokeless tobacco: Never  Substance and Sexual Activity   Alcohol use: No   Drug use: Not Currently    Comment: Marijuana at age 36.   Sexual activity: Yes  Other Topics Concern   Not on file  Social History Narrative   Not on file   Social Determinants of Health   Financial Resource Strain: Not on file  Food Insecurity: Not on file  Transportation Needs: Not on file  Physical Activity: Not on file  Stress: Not on file  Social Connections: Not on file  Intimate Partner Violence: Not on file    Current Outpatient Medications:    ALPRAZolam (XANAX) 0.5 MG tablet, Take 0.5 mg by mouth as needed for anxiety., Disp: ,  Rfl:    amLODipine (NORVASC) 2.5 MG tablet, Take 1 tablet (2.5 mg total) by mouth daily., Disp: 90 tablet, Rfl: 3   busPIRone (BUSPAR) 7.5 MG tablet, Take 7.5 mg by mouth 2 (two) times daily., Disp: , Rfl:    escitalopram (LEXAPRO) 20 MG tablet, Take 1 tablet (20 mg total) by mouth daily., Disp: 90 tablet, Rfl: 2   metoprolol succinate (TOPROL XL) 50 MG 24 hr tablet, Take 1 tablet (50 mg total) by mouth daily., Disp: 90 tablet, Rfl: 3   olmesartan (BENICAR) 20 MG tablet, Take 1 tablet (20 mg total) by mouth daily., Disp:  90 tablet, Rfl: 3   traZODone (DESYREL) 50 MG tablet, Take 1 tablet (50 mg total) by mouth at bedtime., Disp: 90 tablet, Rfl: 1  EXAM:  VITALS per patient if applicable:Ht 5\' 11"  (1.803 m)   BMI 37.55 kg/m   GENERAL: alert, oriented, appears well and in no acute distress  HEENT: atraumatic, conjunctiva clear, no obvious abnormalities on inspection.  NECK: normal movements of the head and neck  LUNGS: on inspection no signs of respiratory distress, breathing rate appears normal, no obvious gross SOB, gasping or wheezing  CV: no obvious cyanosis  MS: moves all visible extremities without noticeable abnormality  PSYCH/NEURO: pleasant and cooperative, no obvious depression or anxiety, speech and thought processing grossly intact  ASSESSMENT AND PLAN:  Discussed the following assessment and plan:  Insomnia due to other mental disorder Assessment & Plan: Trazodone has helped with this problem. We discussed some side effects at the risk of interaction with some of his medications. Continue trazodone 50 mg at bedtime. Instructed to let his psychiatrist know about him to get this medication. We reviewed symptoms of serotonin syndrome. Continue adequate sleep hygiene.  Orders: -     traZODone HCl; Take 1 tablet (50 mg total) by mouth at bedtime.  Dispense: 90 tablet; Refill: 1  Headache, unspecified headache type Assessment & Plan: It seems to happen if he does not sleep well, states that when he takes trazodone he gets a better sleep and he does not have headache the next day. So for now we will hold on head imaging. He will continue trazodone 50 mg daily at bedtime. Instructed about warning signs.   Hypertensive urgency Assessment & Plan: BP has improved, still having a mildly elevated DSP. Continue amlodipine 2.5 mg daily, metoprolol succinate 50 mg daily, and olmesartan 20 mg daily. Continue monitoring BP regularly and following low-salt diet. Follows with cardiology  regularly, Dr Tenny Craw.   We discussed possible serious and likely etiologies, options for evaluation and workup, limitations of telemedicine visit vs in person visit, treatment, treatment risks and precautions. The patient was advised to call back or seek an in-person evaluation if the symptoms worsen or if the condition fails to improve as anticipated. I discussed the assessment and treatment plan with the patient. The patient was provided an opportunity to ask questions and all were answered. The patient agreed with the plan and demonstrated an understanding of the instructions.  Return in about 3 months (around 06/04/2023) for chronic problems.  Siobahn Worsley G. [place], MD  [facility]. [place] [facility].

## 2023-03-04 NOTE — Assessment & Plan Note (Signed)
Trazodone has helped with this problem. We discussed some side effects at the risk of interaction with some of his medications. Continue trazodone 50 mg at bedtime. Instructed to let his psychiatrist know about him to get this medication. We reviewed symptoms of serotonin syndrome. Continue adequate sleep hygiene.

## 2023-03-08 ENCOUNTER — Telehealth: Payer: Self-pay | Admitting: Family Medicine

## 2023-03-08 DIAGNOSIS — F5105 Insomnia due to other mental disorder: Secondary | ICD-10-CM

## 2023-03-08 MED ORDER — TRAZODONE HCL 50 MG PO TABS
50.0000 mg | ORAL_TABLET | Freq: Every day | ORAL | 1 refills | Status: DC
Start: 2023-03-08 — End: 2023-06-17

## 2023-03-08 NOTE — Telephone Encounter (Signed)
Requesting medication traZODone (DESYREL) 50 MG tablet be redirected to  [facility] [id number] - [place], [place] - [address] AT [facility] [address] & [address] Phone: [phone]  Fax: [phone]

## 2023-03-08 NOTE — Telephone Encounter (Signed)
Rx sent 

## 2023-03-14 ENCOUNTER — Encounter: Payer: Self-pay | Admitting: Family Medicine

## 2023-03-14 ENCOUNTER — Telehealth: Payer: Self-pay | Admitting: Internal Medicine

## 2023-03-14 ENCOUNTER — Telehealth: Payer: Self-pay | Admitting: Family Medicine

## 2023-03-14 NOTE — Telephone Encounter (Signed)
Pt would like a letter stating he is under MD's care for the treatment of HTN.  Pt states he needs the letter uploaded to his MyChart today (03/14/23)  (For CDL license)

## 2023-03-14 NOTE — Telephone Encounter (Signed)
Spoke with Pts spouse. They need letter to specifically state that Mark Gamble is "under the care of Dr. Tenny Craw for hypertension" for work. Told them Dr. Tenny Craw was out of the office but I would forward the request to Dr. Tenny Craw and her nurse.

## 2023-03-14 NOTE — Telephone Encounter (Signed)
Done sent through mychart

## 2023-03-14 NOTE — Telephone Encounter (Signed)
Pt's spouse stated pt needs a MD note for work stating he's under MD [facility] care for Hypertension. She stated they can come by the office to pick it up or it can be uploaded to MyChart. She'd like a callback with confirmation of the letter being done and ready to be picked up or once it's been loaded into MyChart. Please advise

## 2023-03-15 ENCOUNTER — Encounter: Payer: Self-pay | Admitting: Internal Medicine

## 2023-03-15 ENCOUNTER — Encounter: Payer: Self-pay | Admitting: *Deleted

## 2023-03-15 NOTE — Telephone Encounter (Signed)
Work letter ready and MyChart message sent to pt per spouse request. (See MyChart message 03/15/23)

## 2023-04-09 ENCOUNTER — Other Ambulatory Visit: Payer: Self-pay | Admitting: Family Medicine

## 2023-04-09 DIAGNOSIS — F331 Major depressive disorder, recurrent, moderate: Secondary | ICD-10-CM

## 2023-04-09 DIAGNOSIS — I10 Essential (primary) hypertension: Secondary | ICD-10-CM

## 2023-06-17 ENCOUNTER — Other Ambulatory Visit: Payer: Self-pay | Admitting: Family Medicine

## 2023-06-17 DIAGNOSIS — F5105 Insomnia due to other mental disorder: Secondary | ICD-10-CM

## 2023-09-16 ENCOUNTER — Ambulatory Visit: Payer: 59 | Admitting: Family Medicine

## 2023-09-27 ENCOUNTER — Other Ambulatory Visit: Payer: Self-pay | Admitting: Internal Medicine

## 2023-10-03 ENCOUNTER — Telehealth: Payer: Self-pay | Admitting: Family Medicine

## 2023-10-03 NOTE — Telephone Encounter (Signed)
Copied from CRM [id number]. Topic: Clinical - Medication Question >> Oct 03, 2023  2:51 PM Tiffany H wrote: Reason for CRM: Patient's wife called to advise that patient is concerned about weight. He would like to speak with Dr. Swaziland about being prescribed some kind of weight loss medication. Will schedule appointment as Dr. Swaziland advises - curious about Zepbound. Please advise.

## 2023-10-12 ENCOUNTER — Ambulatory Visit: Payer: 59 | Admitting: Family Medicine

## 2023-10-12 ENCOUNTER — Encounter: Payer: Self-pay | Admitting: Family Medicine

## 2023-10-12 DIAGNOSIS — G4733 Obstructive sleep apnea (adult) (pediatric): Secondary | ICD-10-CM

## 2023-10-12 DIAGNOSIS — R7303 Prediabetes: Secondary | ICD-10-CM | POA: Diagnosis not present

## 2023-10-12 DIAGNOSIS — I10 Essential (primary) hypertension: Secondary | ICD-10-CM

## 2023-10-12 DIAGNOSIS — Z6841 Body Mass Index (BMI) 40.0 and over, adult: Secondary | ICD-10-CM

## 2023-10-12 MED ORDER — TIRZEPATIDE-WEIGHT MANAGEMENT 2.5 MG/0.5ML ~~LOC~~ SOLN: 2.5000 mg | SUBCUTANEOUS | 1 refills | Status: DC

## 2023-10-12 NOTE — Patient Instructions (Signed)
 Give some feedback in one month regarding the Zepbound  and can consider further titration at that time.

## 2023-10-12 NOTE — Progress Notes (Signed)
 Established Patient Office Visit  Subjective   Patient ID: Mark Gamble, male    DOB: [birth year]  Age: 53 y.o. MRN: [id number]  Chief Complaint  Patient presents with   Medication Consultation    HPI   Mark Gamble is seen today for consultation regarding possible weight loss medications.  He has primary here but was told that they do not prescribe weight loss medications.  He had considered [facility] but prefers to be seen here.  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  He took several anabolic steroids over several years.  Since he quit bodybuilding he has had some increase in body fat and gradual weight gain.  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  Frequent fast food.  Does not get a lot of activity.  Frequently craves snack foods.  He has multiple comorbidities including obstructive sleep apnea treated with CPAP, hypertension, prediabetes with last A1c 5.8%.  He does have a home treadmill which she uses some.  He is already gained 21 more pounds since last visit here in April.  He is followed by cardiology and has hypertension currently treated with Benicar  20 mg daily, metoprolol  XL 50 mg daily, and amlodipine  2.5 mg daily.  His wife takes Zepbound  and has had tremendous success.  He specifically would like to consider GLP-1 medications.  He has no history of pancreatitis.  No family history of thyroid  cancer.  Past Medical History:  Diagnosis Date   Allergy    Anxiety    GERD (gastroesophageal reflux disease)    Hypertension    History reviewed. No pertinent surgical history.  reports that he has never smoked. He has never used smokeless tobacco. He reports that he does not currently use drugs. He reports that he does not drink alcohol. family history includes Alcohol abuse in his father; Anxiety disorder in his father; Cancer in his father; Depression in his father. No Known  Allergies  Review of Systems  Constitutional:  Negative for malaise/fatigue.  Eyes:  Negative for blurred vision.  Respiratory:  Negative for shortness of breath.   Cardiovascular:  Negative for chest pain.  Gastrointestinal:  Negative for abdominal pain, nausea and vomiting.  Neurological:  Negative for dizziness, weakness and headaches.      Objective:     BP (!) 152/92 (BP Location: Left Arm, Cuff Size: Large)   Pulse 90   Temp 97.9 F (36.6 C) (Oral)   Wt 290 lb 9.6 oz (131.8 kg)   SpO2 99%   BMI 40.53 kg/m  BP Readings from Last 3 Encounters:  10/12/23 (!) 152/92  12/31/22 136/80  12/24/22 (!) 142/90   Wt Readings from Last 3 Encounters:  10/12/23 290 lb 9.6 oz (131.8 kg)  12/31/22 269 lb 4 oz (122.1 kg)  12/24/22 271 lb 9.6 oz (123.2 kg)      Physical Exam Vitals reviewed.  Constitutional:      Appearance: He is well-developed.  Eyes:     Pupils: Pupils are equal, round, and reactive to light.  Neck:     Thyroid : No thyromegaly.  Cardiovascular:     Rate and Rhythm: Normal rate and regular rhythm.  Pulmonary:     Effort: Pulmonary effort is normal. No respiratory distress.     Breath sounds: Normal breath sounds. No wheezing or rales.  Musculoskeletal:     Cervical back: Neck supple.  Right lower leg: No edema.     Left lower leg: No edema.  Neurological:     Mental Status: He is alert and oriented to person, place, and time.      No results found for any visits on 10/12/23.  Last CBC Lab Results  Component Value Date   WBC 5.7 12/10/2022   HGB 16.0 12/10/2022   HCT 46.2 12/10/2022   MCV 90.0 12/10/2022   RDW 14.2 12/10/2022   PLT 224.0 12/10/2022   Last metabolic panel Lab Results  Component Value Date   GLUCOSE 102 (H) 01/24/2023   NA 139 01/24/2023   K 4.2 01/24/2023   CL 102 01/24/2023   CO2 19 (L) 01/24/2023   BUN 19 01/24/2023   CREATININE 1.01 01/24/2023   EGFR 89 01/24/2023   CALCIUM 9.5 01/24/2023   PROT 8.0  12/10/2022   ALBUMIN 4.8 12/10/2022   BILITOT 0.4 12/10/2022   ALKPHOS 51 12/10/2022   AST 43 (H) 12/10/2022   ALT 57 (H) 12/10/2022   Last lipids No results found for: CHOL, HDL, LDLCALC, LDLDIRECT, TRIG, CHOLHDL Last hemoglobin A1c Lab Results  Component Value Date   HGBA1C 5.8 (H) 12/24/2022   Last thyroid  functions Lab Results  Component Value Date   TSH 1.12 12/10/2022      The ASCVD Risk score (Arnett DK, et al., 2019) failed to calculate for the following reasons:   Cannot find a previous HDL lab    Assessment & Plan:    Morbid obesity with multiple comorbidities including hypertension, obstructive sleep apnea, prediabetes, arthritis.  -We discussed nonpharmacologic management of weight loss in some detail.  He has good knowledge of nutrition from his bodybuilding days.  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  Try to immediately cut out fast food and do more diligent planning for longer trips. -We had a long discussion regarding pros and cons of medications including GLP-1 medication.  He has no known contraindications.  He is familiar with these in some detail because his wife is currently treated with Zepbound .  He does have multiple comorbidities as above.  He has had poor success with trying to lose weight on his own by restricting calories.  We agreed to start Zepbound  2.5 mg subcutaneous once weekly and give feedback in 1 month.  Would plan titration at that point to 5 mg. Wolm Scarlet, MD

## 2023-10-13 ENCOUNTER — Other Ambulatory Visit (HOSPITAL_COMMUNITY): Payer: Self-pay

## 2023-10-14 ENCOUNTER — Telehealth: Payer: Self-pay

## 2023-10-14 ENCOUNTER — Other Ambulatory Visit (HOSPITAL_COMMUNITY): Payer: Self-pay

## 2023-10-14 NOTE — Telephone Encounter (Signed)
 Copied from CRM [id number]. Topic: Clinical - Prescription Issue >> Oct 13, 2023  4:54 PM [place] J wrote: Reason for CRM: Patient's wife is calling in wanting to see if the prior authorization that was sent into the pharmacy was received.

## 2023-10-14 NOTE — Telephone Encounter (Signed)
 PA request has been Approved. New Encounter created for follow up. For additional info see Pharmacy Prior Auth telephone encounter from 10/14/23.

## 2023-10-14 NOTE — Telephone Encounter (Signed)
 Pharmacy Patient Advocate Encounter   Received notification from Pt Calls Messages that prior authorization for Zepbound  2.5MG /0.5ML pen-injectors is required/requested.   Insurance verification completed.   The patient is insured through [employer] .   Per test claim: PA required and submitted KEY/EOC/Request #: [id number] APPROVED from 09/14/23 to 06/10/24. Ran test claim, Copay is $24.99. This test claim was processed through [facility]- copay amounts may vary at other pharmacies due to pharmacy/plan contracts, or as the patient moves through the different stages of their insurance plan.   CaseId:[id number]

## 2023-10-17 ENCOUNTER — Other Ambulatory Visit (HOSPITAL_COMMUNITY): Payer: Self-pay

## 2023-10-17 NOTE — Telephone Encounter (Signed)
 Selena with [facility] informed of approval.

## 2023-10-31 ENCOUNTER — Encounter: Payer: Self-pay | Admitting: Family Medicine

## 2023-11-02 MED ORDER — ZEPBOUND 5 MG/0.5ML ~~LOC~~ SOAJ: 5.0000 mg | SUBCUTANEOUS | 1 refills | Status: DC

## 2023-11-20 ENCOUNTER — Other Ambulatory Visit: Payer: Self-pay | Admitting: Internal Medicine

## 2023-11-28 ENCOUNTER — Encounter: Payer: Self-pay | Admitting: Family Medicine

## 2023-11-30 ENCOUNTER — Other Ambulatory Visit: Payer: Self-pay | Admitting: Internal Medicine

## 2023-11-30 MED ORDER — TIRZEPATIDE-WEIGHT MANAGEMENT 7.5 MG/0.5ML ~~LOC~~ SOLN: 7.5000 mg | SUBCUTANEOUS | 0 refills | Status: DC

## 2023-12-01 ENCOUNTER — Other Ambulatory Visit: Payer: Self-pay | Admitting: Internal Medicine

## 2023-12-01 ENCOUNTER — Telehealth: Payer: Self-pay | Admitting: Internal Medicine

## 2023-12-01 MED ORDER — AMLODIPINE BESYLATE 2.5 MG PO TABS: 2.5000 mg | ORAL_TABLET | Freq: Every day | ORAL | 0 refills | Status: DC

## 2023-12-01 NOTE — Telephone Encounter (Signed)
 Pt's medication was sent to pt's pharmacy as requested. Confirmation received.

## 2023-12-01 NOTE — Telephone Encounter (Signed)
*  STAT* If patient is at the pharmacy, call can be transferred to refill team.   1. Which medications need to be refilled? (please list name of each medication and dose if known)  amLODipine (NORVASC) 2.5 MG tablet  2. Which pharmacy/location (including street and city if local pharmacy) is medication to be sent to? [facility] - [address] [place], [place] [address]  3. Do they need a 30 day or 90 day supply?   90 day supply

## 2024-01-09 ENCOUNTER — Encounter: Payer: Self-pay | Admitting: Family Medicine

## 2024-01-11 MED ORDER — ZEPBOUND 10 MG/0.5ML ~~LOC~~ SOAJ: 10.0000 mg | SUBCUTANEOUS | 0 refills | Status: DC

## 2024-02-13 ENCOUNTER — Other Ambulatory Visit: Payer: Self-pay

## 2024-02-13 ENCOUNTER — Encounter: Payer: Self-pay | Admitting: Family Medicine

## 2024-02-13 ENCOUNTER — Ambulatory Visit: Admitting: Family Medicine

## 2024-02-13 DIAGNOSIS — I10 Essential (primary) hypertension: Secondary | ICD-10-CM

## 2024-02-13 DIAGNOSIS — F5105 Insomnia due to other mental disorder: Secondary | ICD-10-CM

## 2024-02-13 DIAGNOSIS — G4733 Obstructive sleep apnea (adult) (pediatric): Secondary | ICD-10-CM | POA: Insufficient documentation

## 2024-02-13 MED ORDER — METOPROLOL SUCCINATE ER 50 MG PO TB24: 50.0000 mg | ORAL_TABLET | Freq: Every day | ORAL | 1 refills | Status: DC

## 2024-02-13 MED ORDER — OLMESARTAN MEDOXOMIL 20 MG PO TABS: 20.0000 mg | ORAL_TABLET | Freq: Every day | ORAL | 1 refills | Status: DC

## 2024-02-13 MED ORDER — OLMESARTAN MEDOXOMIL 20 MG PO TABS: 20.0000 mg | ORAL_TABLET | Freq: Every day | ORAL | 1 refills | Status: AC

## 2024-02-13 MED ORDER — METOPROLOL SUCCINATE ER 50 MG PO TB24: 50.0000 mg | ORAL_TABLET | Freq: Every day | ORAL | 1 refills | Status: AC

## 2024-02-13 MED ORDER — AMLODIPINE BESYLATE 2.5 MG PO TABS: 2.5000 mg | ORAL_TABLET | Freq: Every day | ORAL | 1 refills | Status: DC

## 2024-02-13 MED ORDER — AMLODIPINE BESYLATE 2.5 MG PO TABS: 2.5000 mg | ORAL_TABLET | Freq: Every day | ORAL | 1 refills | Status: AC

## 2024-02-13 MED ORDER — TRAZODONE HCL 50 MG PO TABS: ORAL_TABLET | ORAL | 1 refills | Status: DC

## 2024-02-13 NOTE — Progress Notes (Signed)
 Established Patient Office Visit  Subjective   Patient ID: Mark Gamble, male    DOB: December 01, 1970  Age: 53 y.o. MRN: [id number]  No chief complaint on file.   HPI   Mark Gamble has history of morbid obesity, hypertension, obstructive sleep apnea He was referred to me back in February with desire to initiate GLP-1 medication for weight loss.  At that time his weight was 291 pounds for BMI of over 40.  Blood pressure is also poorly controlled frequently back then.  He started sat down and is currently on 10 mg once weekly.  Tolerating well other than some very mild nausea for a day or so when he first takes the injection.  Weight is down about 33 pounds he states he feels better overall.  He states his sleep apnea is improving.  Blood pressure has improved tremendously.  Works as a Naval architect.  Hopes to start exercising more consistently.  Blood pressures currently treated with amlodipine  2.5 mg daily, metoprolol  XL 50 mg daily, Benicar  20 mg daily  Does not have any history of diabetes but does have history of prediabetes range blood sugar with last A1c 5.8%.  Tends to have fairly high pulse at baseline and is surprised his pulse is not down more after starting metoprolol  recently.  Past Medical History:  Diagnosis Date   Allergy    Anxiety    GERD (gastroesophageal reflux disease)    Hypertension    History reviewed. No pertinent surgical history.  reports that he has never smoked. He has never used smokeless tobacco. He reports that he does not currently use drugs. He reports that he does not drink alcohol. family history includes Alcohol abuse in his father; Anxiety disorder in his father; Cancer in his father; Depression in his father. No Known Allergies  Review of Systems  Constitutional:  Negative for malaise/fatigue.  Eyes:  Negative for blurred vision.  Respiratory:  Negative for shortness of breath.   Cardiovascular:  Negative for chest pain.  Neurological:  Negative for  dizziness, weakness and headaches.      Objective:     BP (!) 142/82 (BP Location: Left Arm, Cuff Size: Large)   Pulse 92   Temp 97.8 F (36.6 C) (Oral)   Wt 258 lb 12.8 oz (117.4 kg)   SpO2 97%   BMI 36.10 kg/m  BP Readings from Last 3 Encounters:  02/13/24 (!) 142/82  10/12/23 (!) 152/92  12/31/22 136/80   Wt Readings from Last 3 Encounters:  02/13/24 258 lb 12.8 oz (117.4 kg)  10/12/23 290 lb 9.6 oz (131.8 kg)  12/31/22 269 lb 4 oz (122.1 kg)      Physical Exam Vitals reviewed.  Constitutional:      Appearance: He is well-developed.  HENT:     Right Ear: External ear normal.     Left Ear: External ear normal.  Eyes:     Pupils: Pupils are equal, round, and reactive to light.  Neck:     Thyroid : No thyromegaly.  Cardiovascular:     Rate and Rhythm: Normal rate and regular rhythm.  Pulmonary:     Effort: Pulmonary effort is normal. No respiratory distress.     Breath sounds: Normal breath sounds. No wheezing or rales.  Musculoskeletal:     Cervical back: Neck supple.  Neurological:     Mental Status: He is alert and oriented to person, place, and time.      No results found for any visits on 02/13/24.  The ASCVD Risk score (Arnett DK, et al., 2019) failed to calculate for the following reasons:   Cannot find a previous HDL lab    Assessment & Plan:   #1 morbid obesity.  Patient has had steady weight loss since starting Zepbound  and is currently on 10 mg once weekly.  His only had 2 doses at this milligram thus far.  He is very pleased overall with his 33 pound weight loss.  Sleep apnea is improving as well as blood pressure.  Recommend least couple months at this dosage and then further titrate if he desires that point especially if weight is plateauing.  He would like to get down to a goal weight of around 230 pounds  #2 hypertension.  Improving.  Initial blood pressure up some today but did improve some after rest.  Refilled blood pressure  medications until he can get back into see primary.  Mark Lamy, MD

## 2024-04-03 ENCOUNTER — Telehealth: Payer: Self-pay

## 2024-04-03 MED ORDER — ZEPBOUND 12.5 MG/0.5ML ~~LOC~~ SOAJ: 12.5000 mg | SUBCUTANEOUS | 0 refills | Status: DC

## 2024-04-03 NOTE — Addendum Note (Signed)
 Addended by: METTA KRISTEN CROME on: 04/03/2024 01:12 PM   Modules accepted: Orders

## 2024-04-03 NOTE — Telephone Encounter (Signed)
 Copied from CRM [id number]. Topic: Clinical - Medication Question >> Apr 03, 2024 12:28 PM Paige D wrote: Reason for CRM: PT is all out of the 10 mg pt  would like to move up to the 12.5 mg zepbound  pt was told by Dr. Micheal to call the office when ready to move up on zepbound . Pt would like prescription 12.5 mg sent over to the pharmacy [facility] on [address] in [place]. Pt would also like 90 day supply for insurance purposes

## 2024-04-03 NOTE — Telephone Encounter (Signed)
 Rx sent.

## 2024-06-06 ENCOUNTER — Other Ambulatory Visit: Payer: Self-pay | Admitting: Family Medicine

## 2024-08-20 ENCOUNTER — Telehealth: Payer: Self-pay | Admitting: Family Medicine

## 2024-08-20 NOTE — Telephone Encounter (Unsigned)
 Copied from CRM #[id number]. Topic: Clinical - Medication Refill >> Aug 20, 2024 11:15 AM China J wrote: Medication:  ZEPBOUND  15 MG  Has the patient contacted their pharmacy? No (Agent: If no, request that the patient contact the pharmacy for the refill. If patient does not wish to contact the pharmacy document the reason why and proceed with request.) (Agent: If yes, when and what did the pharmacy advise?) Patient is needing a new script to increase dose.  This is the patient's preferred pharmacy:  [facility] [phone] - [place], [place] - [address] AT [facility] [address] [place] [address] Phone: [phone] Fax: [phone]  Is this the correct pharmacy for this prescription? Yes If no, delete pharmacy and type the correct one.   Has the prescription been filled recently? No  Is the patient out of the medication? No  Has the patient been seen for an appointment in the last year OR does the patient have an upcoming appointment? Yes  Can we respond through MyChart? No Please call spouse for any update at [phone]  Agent: Please be advised that Rx refills may take up to 3 business days. We ask that you follow-up with your pharmacy.

## 2024-08-20 NOTE — Telephone Encounter (Signed)
 Pt requesting dosage increase.

## 2024-08-21 ENCOUNTER — Other Ambulatory Visit: Payer: Self-pay

## 2024-08-21 MED ORDER — TIRZEPATIDE-WEIGHT MANAGEMENT 15 MG/0.5ML ~~LOC~~ SOAJ: 15.0000 mg | SUBCUTANEOUS | 0 refills | Status: DC

## 2024-08-21 NOTE — Telephone Encounter (Signed)
 Dr.Bruce W Burchette stated it was okay to fill Zepound 15mg  for one time only till patients appointment on 09/14/24 with Dr. Micheal. Medication sent.

## 2024-08-21 NOTE — Telephone Encounter (Signed)
 Patient has one 09/14/24 with you. I will let him know he has to wait till then.

## 2024-08-21 NOTE — Telephone Encounter (Signed)
 Patient stated he is going to go up on his zepound to 15 MG because his job is less active right now and does not want to gain any weight. He wanted to know if you could send in another refill to last till he seen you. Please advise ?

## 2024-08-21 NOTE — Telephone Encounter (Signed)
 Medication sent. Patient has appointment with you on 09/14/24.

## 2024-08-21 NOTE — Telephone Encounter (Signed)
 I did not prescribe it. I usually send pts to [facility] for [facility]. Thanks, BJ

## 2024-08-24 ENCOUNTER — Telehealth: Payer: Self-pay

## 2024-08-24 ENCOUNTER — Other Ambulatory Visit (HOSPITAL_COMMUNITY): Payer: Self-pay

## 2024-08-24 NOTE — Telephone Encounter (Signed)
 Pharmacy Patient Advocate Encounter  Received notification from [facility] [employer] that Prior Authorization for Zepbound  15 has been APPROVED from 07/25/24 to 08/24/24. Ran test claim, Copay is $25.00. This test claim was processed through [facility]- copay amounts may vary at other pharmacies due to pharmacy/plan contracts, or as the patient moves through the different stages of their insurance plan.   PA #/Case ID/Reference #: # [id number]

## 2024-08-24 NOTE — Telephone Encounter (Signed)
 Pharmacy Patient Advocate Encounter   Received notification from Onbase that prior authorization for Zepbound  15 is required/requested.   Insurance verification completed.   The patient is insured through [employer].   Per test claim: PA required; PA submitted to above mentioned insurance via Latent Key/confirmation #/EOC [id number] Status is pending

## 2024-09-14 ENCOUNTER — Encounter: Payer: Self-pay | Admitting: Family Medicine

## 2024-09-14 ENCOUNTER — Ambulatory Visit: Admitting: Family Medicine

## 2024-09-14 VITALS — BP 96/66 | HR 78 | Temp 97.4°F | Wt 218.5 lb

## 2024-09-14 DIAGNOSIS — I10 Essential (primary) hypertension: Secondary | ICD-10-CM | POA: Diagnosis not present

## 2024-09-14 DIAGNOSIS — E669 Obesity, unspecified: Secondary | ICD-10-CM

## 2024-09-14 DIAGNOSIS — R7303 Prediabetes: Secondary | ICD-10-CM

## 2024-09-14 LAB — POCT GLYCOSYLATED HEMOGLOBIN (HGB A1C): Hemoglobin A1C: 5.3 % (ref 4.0–5.6)

## 2024-09-14 MED ORDER — TIRZEPATIDE-WEIGHT MANAGEMENT 15 MG/0.5ML ~~LOC~~ SOAJ: 15.0000 mg | SUBCUTANEOUS | 3 refills | Status: AC

## 2024-09-14 NOTE — Patient Instructions (Addendum)
 I would consider HOLDING the Amlodipine  with the recent weight loss and improved blood pressure.    Monitor blood pressure and if going back up > 140/90 go back on Amlodipine .

## 2024-09-14 NOTE — Progress Notes (Unsigned)
" ° °  Established Patient Office Visit  Subjective   Patient ID: Mark Gamble, male    DOB: Apr 07, 1971  Age: 54 y.o. MRN: [id number]  Chief Complaint  Patient presents with   Medical Management of Chronic Issues    HPI  {History (Optional):23778} Mark Gamble is seen today to follow-up GLP-1 prescription use.  I had seen him about a year ago as his primary was not prescribing GLP-1 medications.  He had weight at that time of around 291 pounds.  Obstructive sleep apnea.  Also history of hypertension.  He had tremendous response with Mounjaro  and current weight of 218 pounds.  His goal is 220 pounds.  His blood pressure has improved dramatically.  He denies any dizziness but has blood pressure today in office 96/66.  He has noted that his sleep apnea also seems to be improved.  Very pleased with his results overall.  He had prediabetes range blood sugar with A1c last year 5.8.  Requesting recheck today.  Past Medical History:  Diagnosis Date   Allergy    Anxiety    GERD (gastroesophageal reflux disease)    Hypertension    History reviewed. No pertinent surgical history.  reports that he has never smoked. He has never used smokeless tobacco. He reports that he does not currently use drugs. He reports that he does not drink alcohol. family history includes Alcohol abuse in his father; Anxiety disorder in his father; Cancer in his father; Depression in his father. Allergies[1]  Review of Systems  Constitutional:  Negative for malaise/fatigue.  Eyes:  Negative for blurred vision.  Respiratory:  Negative for shortness of breath.   Cardiovascular:  Negative for chest pain.  Neurological:  Negative for dizziness, weakness and headaches.      Objective:     BP 96/66   Pulse 78   Temp (!) 97.4 F (36.3 C) (Oral)   Wt 218 lb 8 oz (99.1 kg)   SpO2 97%   BMI 30.47 kg/m  {Vitals History (Optional):23777}  Physical Exam Vitals reviewed.  Constitutional:      Appearance: He is well-developed.   Eyes:     Pupils: Pupils are equal, round, and reactive to light.  Neck:     Thyroid : No thyromegaly.  Cardiovascular:     Rate and Rhythm: Normal rate and regular rhythm.  Pulmonary:     Effort: Pulmonary effort is normal. No respiratory distress.     Breath sounds: Normal breath sounds. No wheezing or rales.  Musculoskeletal:     Cervical back: Neck supple.  Neurological:     Mental Status: He is alert and oriented to person, place, and time.      No results found for any visits on 09/14/24.  {Labs (Optional):23779}  The ASCVD Risk score (Arnett DK, et al., 2019) failed to calculate for the following reasons:   Cannot find a previous HDL lab    Assessment & Plan:   #1 obesity.  Dramatically improved with Zepbound  with current doses of 15 mg once weekly.  He feels much better overall with increased stamina and improved blood pressure  #2 history of hypertension.  Current blood pressure 96/66.  He is on regimen of Toprol  XL, olmesartan , and amlodipine .  Can discuss with primary but I would consider possibly holding his amlodipine  at this point  #3 history of prediabetes.  Patient requesting repeat A1c today.  Wolm Scarlet, MD     [1] No Known Allergies  "

## 2024-09-20 ENCOUNTER — Telehealth: Payer: Self-pay

## 2024-09-20 NOTE — Telephone Encounter (Signed)
 Patient is needing PA for Zepbund 15 mg Covermymeds  Key: [id number]

## 2024-09-27 ENCOUNTER — Other Ambulatory Visit (HOSPITAL_COMMUNITY): Payer: Self-pay

## 2024-09-27 ENCOUNTER — Telehealth: Payer: Self-pay

## 2024-09-27 NOTE — Telephone Encounter (Signed)
 Pharmacy Patient Advocate Encounter   Received notification from [employer] that prior authorization for Zepbound  15MG /0.5ML pen-injectors  is required/requested.   Insurance verification completed.   The patient is insured through [employer].   Per test claim: The current 28 day co-pay is, $25.  No PA needed at this time. This test claim was processed through [facility]- copay amounts may vary at other pharmacies due to pharmacy/plan contracts, or as the patient moves through the different stages of their insurance plan.     Per test claim, current PA expires 08/24/25

## 2024-09-27 NOTE — Telephone Encounter (Signed)
 Per test claim: The current 28 day co-pay is, $25.  No PA needed at this time.   Per test claim, current PA expires 08/24/25

## 2024-09-28 NOTE — Telephone Encounter (Signed)
 Noted.

## 2024-10-11 ENCOUNTER — Other Ambulatory Visit: Payer: Self-pay | Admitting: Family Medicine

## 2024-10-11 DIAGNOSIS — F5105 Insomnia due to other mental disorder: Secondary | ICD-10-CM
